# Patient Record
Sex: Male | Born: 1965 | Race: Black or African American | Hispanic: No | Marital: Married | Smoking: Former smoker
Health system: Southern US, Community
[De-identification: ages and names within clinical notes are randomized; demographics above are authoritative.]

## PROBLEM LIST (undated history)

## (undated) DIAGNOSIS — E119 Type 2 diabetes mellitus without complications: Secondary | ICD-10-CM

## (undated) HISTORY — DX: Type 2 diabetes mellitus without complications: E11.9

---

## 2001-04-07 ENCOUNTER — Observation Stay (HOSPITAL_COMMUNITY): Admission: EM | Admit: 2001-04-07 | Discharge: 2001-04-08 | Payer: Self-pay | Admitting: Emergency Medicine

## 2001-04-07 ENCOUNTER — Encounter: Payer: Self-pay | Admitting: Orthopedic Surgery

## 2001-04-07 ENCOUNTER — Encounter: Payer: Self-pay | Admitting: Emergency Medicine

## 2001-05-08 ENCOUNTER — Encounter: Admission: RE | Admit: 2001-05-08 | Discharge: 2001-06-05 | Payer: Self-pay | Admitting: Orthopedic Surgery

## 2006-09-26 ENCOUNTER — Ambulatory Visit: Payer: Self-pay | Admitting: Nurse Practitioner

## 2006-10-17 ENCOUNTER — Ambulatory Visit: Payer: Self-pay | Admitting: *Deleted

## 2007-07-10 ENCOUNTER — Encounter (INDEPENDENT_AMBULATORY_CARE_PROVIDER_SITE_OTHER): Payer: Self-pay | Admitting: *Deleted

## 2007-10-22 ENCOUNTER — Encounter (INDEPENDENT_AMBULATORY_CARE_PROVIDER_SITE_OTHER): Payer: Self-pay | Admitting: Nurse Practitioner

## 2007-10-22 ENCOUNTER — Ambulatory Visit: Payer: Self-pay | Admitting: Internal Medicine

## 2007-10-22 LAB — CONVERTED CEMR LAB
ALT: 45 units/L (ref 0–53)
AST: 29 units/L (ref 0–37)
Albumin: 4.7 g/dL (ref 3.5–5.2)
Alkaline Phosphatase: 112 units/L (ref 39–117)
BUN: 12 mg/dL (ref 6–23)
Basophils Absolute: 0 10*3/uL (ref 0.0–0.1)
Basophils Relative: 0 % (ref 0–1)
CO2: 23 meq/L (ref 19–32)
Calcium: 9.5 mg/dL (ref 8.4–10.5)
Chloride: 100 meq/L (ref 96–112)
Creatinine, Ser: 1.13 mg/dL (ref 0.40–1.50)
Eosinophils Absolute: 0.1 10*3/uL (ref 0.0–0.7)
Eosinophils Relative: 2 % (ref 0–5)
Glucose, Bld: 211 mg/dL — ABNORMAL HIGH (ref 70–99)
HCT: 43 % (ref 39.0–52.0)
Hemoglobin: 15.2 g/dL (ref 13.0–17.0)
Lymphocytes Relative: 31 % (ref 12–46)
Lymphs Abs: 2.4 10*3/uL (ref 0.7–4.0)
MCHC: 35.3 g/dL (ref 30.0–36.0)
MCV: 87.2 fL (ref 78.0–100.0)
Monocytes Absolute: 1.2 10*3/uL — ABNORMAL HIGH (ref 0.1–1.0)
Monocytes Relative: 15 % — ABNORMAL HIGH (ref 3–12)
Neutro Abs: 3.9 10*3/uL (ref 1.7–7.7)
Neutrophils Relative %: 52 % (ref 43–77)
Platelets: 222 10*3/uL (ref 150–400)
Potassium: 4.2 meq/L (ref 3.5–5.3)
RBC: 4.93 M/uL (ref 4.22–5.81)
RDW: 12.7 % (ref 11.5–15.5)
Sodium: 136 meq/L (ref 135–145)
TSH: 1.7 microintl units/mL (ref 0.350–5.50)
Total Bilirubin: 0.9 mg/dL (ref 0.3–1.2)
Total Protein: 8 g/dL (ref 6.0–8.3)
WBC: 7.6 10*3/uL (ref 4.0–10.5)

## 2007-12-30 ENCOUNTER — Ambulatory Visit: Payer: Self-pay | Admitting: Family Medicine

## 2008-06-15 ENCOUNTER — Ambulatory Visit: Payer: Self-pay | Admitting: Internal Medicine

## 2008-06-25 ENCOUNTER — Ambulatory Visit: Payer: Self-pay | Admitting: Internal Medicine

## 2008-06-25 ENCOUNTER — Ambulatory Visit (HOSPITAL_COMMUNITY): Admission: RE | Admit: 2008-06-25 | Discharge: 2008-06-25 | Payer: Self-pay | Admitting: Internal Medicine

## 2008-06-26 ENCOUNTER — Ambulatory Visit: Payer: Self-pay | Admitting: Internal Medicine

## 2008-07-28 ENCOUNTER — Ambulatory Visit: Payer: Self-pay | Admitting: Family Medicine

## 2008-07-28 LAB — CONVERTED CEMR LAB: Microalb, Ur: 8.01 mg/dL — ABNORMAL HIGH (ref 0.00–1.89)

## 2008-07-31 ENCOUNTER — Ambulatory Visit: Payer: Self-pay | Admitting: Internal Medicine

## 2008-09-14 ENCOUNTER — Ambulatory Visit: Payer: Self-pay | Admitting: Internal Medicine

## 2008-09-14 ENCOUNTER — Encounter (INDEPENDENT_AMBULATORY_CARE_PROVIDER_SITE_OTHER): Payer: Self-pay | Admitting: Family Medicine

## 2008-09-14 LAB — CONVERTED CEMR LAB
ALT: 37 units/L (ref 0–53)
AST: 26 units/L (ref 0–37)
Albumin: 4.6 g/dL (ref 3.5–5.2)
Alkaline Phosphatase: 86 units/L (ref 39–117)
BUN: 11 mg/dL (ref 6–23)
Basophils Absolute: 0 10*3/uL (ref 0.0–0.1)
Basophils Relative: 1 % (ref 0–1)
CO2: 21 meq/L (ref 19–32)
Calcium: 9.4 mg/dL (ref 8.4–10.5)
Chloride: 106 meq/L (ref 96–112)
Cholesterol: 217 mg/dL — ABNORMAL HIGH (ref 0–200)
Creatinine, Ser: 0.92 mg/dL (ref 0.40–1.50)
Eosinophils Absolute: 0.4 10*3/uL (ref 0.0–0.7)
Eosinophils Relative: 6 % — ABNORMAL HIGH (ref 0–5)
Glucose, Bld: 171 mg/dL — ABNORMAL HIGH (ref 70–99)
HCT: 43 % (ref 39.0–52.0)
HDL: 45 mg/dL (ref >39)
Hemoglobin: 14.2 g/dL (ref 13.0–17.0)
LDL Cholesterol: 126 mg/dL — ABNORMAL HIGH (ref 0–99)
Lymphocytes Relative: 36 % (ref 12–46)
Lymphs Abs: 2.8 10*3/uL (ref 0.7–4.0)
MCHC: 33 g/dL (ref 30.0–36.0)
MCV: 89.6 fL (ref 78.0–100.0)
Monocytes Absolute: 0.9 10*3/uL (ref 0.1–1.0)
Monocytes Relative: 11 % (ref 3–12)
Neutro Abs: 3.6 10*3/uL (ref 1.7–7.7)
Neutrophils Relative %: 47 % (ref 43–77)
PSA: 0.53 ng/mL (ref 0.10–4.00)
Platelets: 216 10*3/uL (ref 150–400)
Potassium: 4.2 meq/L (ref 3.5–5.3)
RBC: 4.8 M/uL (ref 4.22–5.81)
RDW: 13.4 % (ref 11.5–15.5)
Sodium: 142 meq/L (ref 135–145)
Total Bilirubin: 0.9 mg/dL (ref 0.3–1.2)
Total CHOL/HDL Ratio: 4.8
Total Protein: 7.1 g/dL (ref 6.0–8.3)
Triglycerides: 230 mg/dL — ABNORMAL HIGH (ref <150)
VLDL: 46 mg/dL — ABNORMAL HIGH (ref 0–40)
WBC: 7.7 10*3/uL (ref 4.0–10.5)

## 2008-12-23 ENCOUNTER — Ambulatory Visit: Payer: Self-pay | Admitting: Internal Medicine

## 2008-12-28 ENCOUNTER — Ambulatory Visit: Payer: Self-pay | Admitting: Internal Medicine

## 2008-12-28 ENCOUNTER — Encounter (INDEPENDENT_AMBULATORY_CARE_PROVIDER_SITE_OTHER): Payer: Self-pay | Admitting: Family Medicine

## 2008-12-28 LAB — CONVERTED CEMR LAB
ALT: 49 units/L (ref 0–53)
AST: 28 units/L (ref 0–37)
Albumin: 4.6 g/dL (ref 3.5–5.2)
Alkaline Phosphatase: 90 units/L (ref 39–117)
BUN: 13 mg/dL (ref 6–23)
CO2: 19 meq/L (ref 19–32)
Calcium: 9.3 mg/dL (ref 8.4–10.5)
Chloride: 106 meq/L (ref 96–112)
Cholesterol: 236 mg/dL — ABNORMAL HIGH (ref 0–200)
Creatinine, Ser: 0.84 mg/dL (ref 0.40–1.50)
Glucose, Bld: 139 mg/dL — ABNORMAL HIGH (ref 70–99)
HDL: 48 mg/dL (ref >39)
LDL Cholesterol: 115 mg/dL — ABNORMAL HIGH (ref 0–99)
Potassium: 4.1 meq/L (ref 3.5–5.3)
Sodium: 137 meq/L (ref 135–145)
Total Bilirubin: 0.9 mg/dL (ref 0.3–1.2)
Total CHOL/HDL Ratio: 4.9
Total Protein: 7.7 g/dL (ref 6.0–8.3)
Triglycerides: 364 mg/dL — ABNORMAL HIGH (ref <150)
VLDL: 73 mg/dL — ABNORMAL HIGH (ref 0–40)

## 2009-10-12 IMAGING — CR DG FINGER THUMB 2+V*R*
3 series · 3 of 3 positions shown · non-contrast
Comparison: None.

CLINICAL DATA: Pain and swelling particularly in the vicinity of
the IP joint.  No recent injuries.

RIGHT THUMB 2+V 06/25/2008:

[x finger pa right]
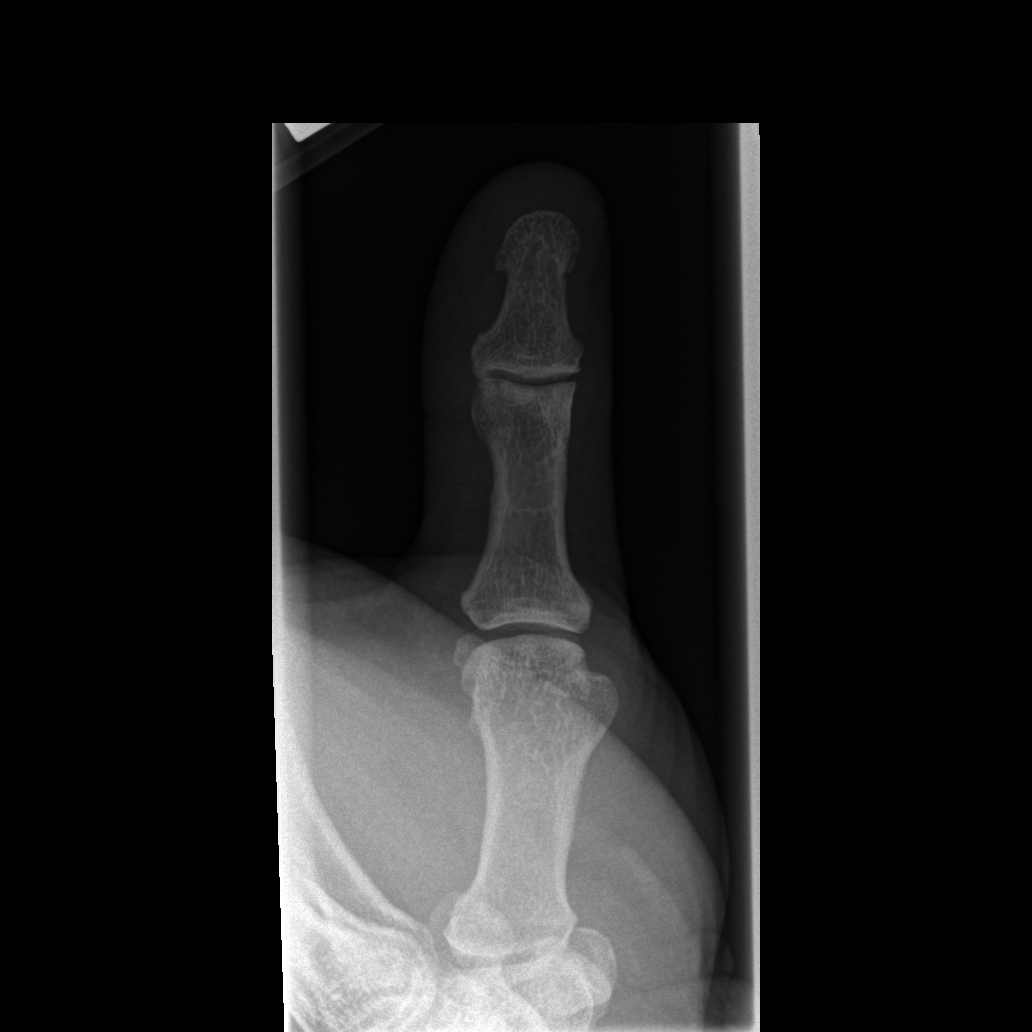

[x finger obl. right]
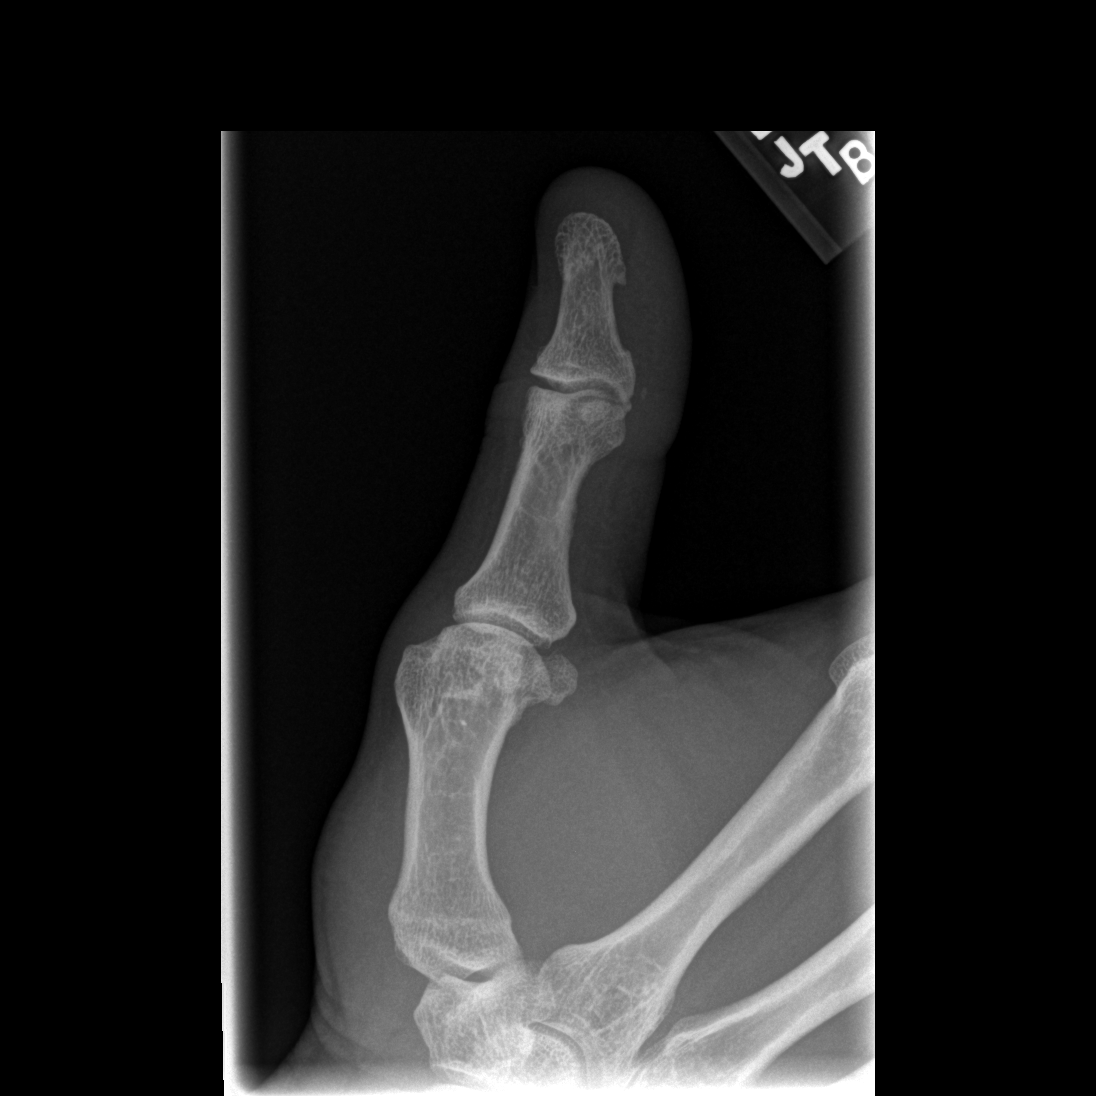

[x finger lateral right]
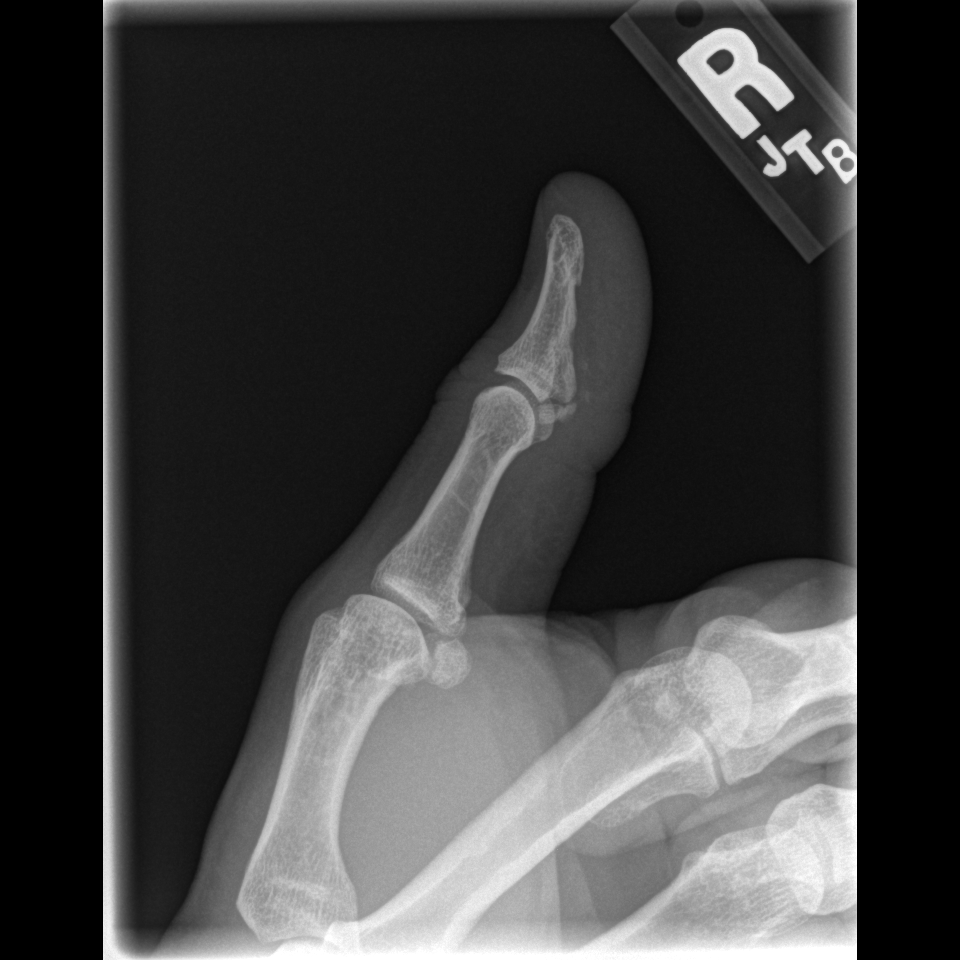

[3 of 3 positions shown; findings below may reference images not displayed]

FINDINGS: No evidence of acute or subacute fracture or dislocation.
Well-preserved joint spaces.  No intrinsic osseous abnormalities.
IMPRESSION: Normal examination.

## 2011-03-10 NOTE — H&P (Signed)
[place]  Patient:    Terry Horne, Terry Horne                      MRN: [id number] Adm. Date:  [id number] Attending:  Tobey Bride                         History and Physical  CHIEF COMPLAINT: Left patellar ligament rupture.  HISTORY OF PRESENT ILLNESS: The patient is a 45 year old man, who works as a short-distance Naval architect and was playing recreational basketball at approximately 1700 hours on April 08, 1999, was warming up doing a lay-up and felt a pop in his left knee, with immediate onset of pain and weakness, and his knee buckled.  He presented to the [facility] Emergency Room and was noted to have a defect in the patellar ligament and a high-riding patella on AP and lateral views of the knee.  He was unable to be straight leg raising and orthopedic consultation was obtained for presumed left patellar ligament rupture.  ALLERGIES: PENICILLIN.  MEDICATIONS: None.  PAST SURGICAL HISTORY:  1. ACL reconstruction on the right knee by Dr. Ronnell Guadalajara in 1982.  2. Excision of a pilonidal cyst in 1993.  SOCIAL HISTORY: He lives with his wife.  He lives in a two-story house, although it can be converted for single-story use.  He smokes 1-1/2 packs of cigarettes a day and uses two to three ounces of alcohol per week, usually in the form of a beer.  PHYSICAL EXAMINATION:  VITAL SIGNS: Respiratory rate was 18, pulse was 76, blood pressure was 130/80. He was afebrile.  GENERAL: Well-developed, well-nourished 45 year old black male in no apparent distress.  HEENT: EOMI.  Nose and throat clear.  NECK: Supple.  BACK: Spine nontender.  LUNGS: Lungs clear.  HEART: Regular rate and rhythm.  ABDOMEN: Soft, nontender.  EXTREMITIES: Full range of motion of upper extremities as well as the right lower extremity.  Well-healed surgical scar of right knee.  The left knee had a palpable defect in the patellar ligament.   Normal pulses of the feet, normal sensation to the feet.  He could not do a straight leg raise on the left side.  LABORATORY DATA: X-rays confirmed a high-riding patella alta consistent with an acute patellar ligament rupture.  There was very little in the way of any effusion in the knee and it is unlikely that he tore the retinaculum; the skin was intact and there were no abrasions.  ASSESSMENT: Acute left patellar ligament rupture with little, if any, effusion.  PLAN: He will be taken for open reduction and internal fixation this evening in the hopes of avoiding morbidity from effusion, swelling, and pain.  The risks and benefits of surgery were discussed with the patient and he was prepared for surgical intervention. DD:  04/07/01 TD:  04/08/01 Job: [id number]

## 2011-03-10 NOTE — Op Note (Signed)
[place]  Patient:    Terry Horne, Terry Horne                      MRN: [id number] Proc. Date: 04/07/01 Adm. Date:  [id number] Attending:  Carren Rang K                           Operative Report  PREOPERATIVE DIAGNOSIS:  Left patella ligament rupture.  POSTOPERATIVE DIAGNOSIS:  Left patella ligament rupture.  OPERATION:  Open reduction and internal fixation using two 5 mm Mersilene tape loops through drill holes in the distal third of the patella and the tibia tubercle.  The tendon itself was repaired with #1 Vicryl suture and the #5 Mersilene tapes were used to keep the patella in correct alignment with the tibia tubercle.  SURGEON:  Alinda Deem, M.D.  ASSISTANT:  Dorthula Matas, P.A.-C.  ANESTHESIA:  General endotracheal anesthesia.  ESTIMATED BLOOD LOSS:  Minimal.  FLUIDS:  800 cc of crystalloid.  TOURNIQUET:  Tourniquet time was 30 minutes.  INDICATION:  A 45 year old man playing basketball on April 07, 2001 and sustained an acute left patella tendon rupture with the patella to ligament ratio greater than 2:1 consistent with a complete rupture.  He was unable to extend his leg in the emergency room.  Remarkably, he had little if any swelling.  In any event, urgent repair of ligament was recommended and consented to by the patient.  DESCRIPTION OF PROCEDURE:  The patient identified by arm band and taken to the operating room at [facility].  Appropriate anesthetic monitors were attached.  General endotracheal anesthesia induced with the patient in the supine position.  Tourniquet was applied high to the left side and the left lower extremity prepped and draped in the usual sterile fashion from the ankle to the tourniquet.  Limb was wrapped with an Esmarch bandage. Tourniquet inflated to 350 mmHg.  An anterior midline incision was made from the proximal aspect of the patella just medial and distal to the  tibia tubercle.  We immediately identified the shredded ends of the ligaments anteriorly.  The shredded portion was intact to the patella and torn off of the tibia tubercle and then posteriorly a more major bundle was intact to the tubercle but torn off the patella.  At this point, we selected a 1/8th inch drill bit and drilled a transverse hole through the tibia tubercle and another one through the distal third of the patella transverse and then passed two 5 mm Mersilene tapes through these holes, putting a knot medial and another one lateral.  By tensioning these knots, we put the patella in the correct relation to the tibia tubercle recreating a ratio of the patella diagonal to the ligament of 1:1.  This was confirmed with C-arm imaging and the knots were then cinched down and multiple throws passed in each suture.  Using #1 Vicryl suture, we then sewed the retinacular tears medially and laterally using a running suture and also repaired the anterior and posterior leaflets of the tendon to each other again using a running #1 Vicryl suture. The tourniquet was let down.  Small bleeders were identified and cauterized. The wound was washed out with normal saline solution.  The subcutaneous tissue was then closed with running 2-0 Vicryl suture and the skin with staples.  A dressing of Xeroform, 4 by 4 dressing, sponges, Webril, an Ace wrap, and  a knee immobilizer were applied.  The patient was then awakened and taken to the recovery room without difficulty. DD:  04/07/01 TD:  04/08/01 Job: [id number]

## 2013-03-18 ENCOUNTER — Ambulatory Visit: Payer: Self-pay | Admitting: Family Medicine

## 2013-03-18 VITALS — BP 122/88 | HR 71 | Temp 97.9°F | Resp 16 | Ht 73.5 in | Wt 202.0 lb

## 2013-03-18 DIAGNOSIS — Z0289 Encounter for other administrative examinations: Secondary | ICD-10-CM

## 2013-03-18 DIAGNOSIS — Z0000 Encounter for general adult medical examination without abnormal findings: Secondary | ICD-10-CM

## 2013-03-18 NOTE — Progress Notes (Signed)
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  Patient has had diabetes for 7 years after having quit cigarettes 8 years ago. He's never been hospitalized and his blood sugars about under good control, although he does not remember his last A1c value. He has an appointment with his diabetes doctor tomorrow or [place] physician.  Objective: No disqualifying findings on DOT physical (see physical form)  Assessment: Qualified for one year certificate.

## 2013-05-10 ENCOUNTER — Encounter: Payer: Self-pay | Admitting: Family Medicine

## 2014-02-23 ENCOUNTER — Ambulatory Visit: Payer: Self-pay | Admitting: Emergency Medicine

## 2014-02-23 VITALS — BP 126/72 | HR 78 | Temp 98.3°F | Resp 16 | Ht 73.5 in | Wt 220.4 lb

## 2014-02-23 DIAGNOSIS — Z0289 Encounter for other administrative examinations: Secondary | ICD-10-CM

## 2014-02-23 NOTE — Progress Notes (Signed)
[facility] [address], [place] [address] [phone]  Date:  02/23/2014   Name:  Terry CongressCharles G Harvill   DOB:  01-Sep-1966   MRN:  [id number]  PCP:  No primary provider on file.    Chief Complaint: Annual Exam   History of Present Illness:  Terry CongressCharles G Salser is a 48 y.o. very pleasant male patient who presents with the following:  DOT  There are no active problems to display for this patient.   Past Medical History  Diagnosis Date  . Diabetes mellitus without complication     History reviewed. No pertinent past surgical history.  History  Substance Use Topics  . Smoking status: Former Games developermoker  . Smokeless tobacco: Not on file  . Alcohol Use: Yes    History reviewed. No pertinent family history.  No Known Allergies  Medication list has been reviewed and updated.  Current Outpatient Prescriptions on File Prior to Visit  Medication Sig Dispense Refill  . glimepiride (AMARYL) 4 MG tablet Take 4 mg by mouth daily before breakfast.      . metFORMIN (GLUCOPHAGE) 1000 MG tablet Take 1,000 mg by mouth 2 (two) times daily with a meal.       No current facility-administered medications on file prior to visit.    Review of Systems:  As per HPI, otherwise negative.    Physical Examination: Filed Vitals:   02/23/14 1429  BP: 126/72  Pulse: 78  Temp: 98.3 F (36.8 C)  Resp: 16   Filed Vitals:   02/23/14 1429  Height: 6' 1.5" (1.867 m)  Weight: 220 lb 6.4 oz (99.973 kg)   Body mass index is 28.68 kg/(m^2). Ideal Body Weight: Weight in (lb) to have BMI = 25: 191.7  GEN: WDWN, NAD, Non-toxic, A & O x 3 HEENT: Atraumatic, Normocephalic. Neck supple. No masses, No LAD. Ears and Nose: No external deformity. CV: RRR, No M/G/R. No JVD. No thrill. No extra heart sounds. PULM: CTA B, no wheezes, crackles, rhonchi. No retractions. No resp. distress. No accessory muscle use. ABD: S, NT, ND, +BS. No rebound. No HSM. EXTR: No c/c/e NEURO Normal gait.   PSYCH: Normally interactive. Conversant. Not depressed or anxious appearing.  Calm demeanor.    Assessment and Plan: DOT NIDDM  HBA1C 7.4  Signed,  Phillips OdorJeffery Anderson, MD

## 2015-03-09 ENCOUNTER — Ambulatory Visit (INDEPENDENT_AMBULATORY_CARE_PROVIDER_SITE_OTHER): Payer: Self-pay | Admitting: Family Medicine

## 2015-03-09 VITALS — BP 110/74 | HR 75 | Temp 98.6°F | Resp 20 | Ht 73.5 in | Wt 216.4 lb

## 2015-03-09 DIAGNOSIS — Z0000 Encounter for general adult medical examination without abnormal findings: Secondary | ICD-10-CM

## 2015-03-09 NOTE — Progress Notes (Signed)
[facility] [address], [place] [address] [phone]  Date:  03/09/2015   Name:  Terry Horne   DOB:  1966/06/13   MRN:  [id number]  PCP:  No primary care provider on file.    Chief Complaint: DOT Physical   History of Present Illness:  Terry Horne is a 49 y.o. very pleasant male patient who presents with the following:  Here today for a DOT PE.  History of DM- he is in the process of getting a new PCP but as far as he knows his A1c has been well controlled Occasional social alcohol History of knee surgery- OW his history is negative    There are no active problems to display for this patient.   Past Medical History  Diagnosis Date  . Diabetes mellitus without complication     History reviewed. No pertinent past surgical history.  History  Substance Use Topics  . Smoking status: Former Games developermoker  . Smokeless tobacco: Never Used  . Alcohol Use: 0.0 oz/week    0 Standard drinks or equivalent per week    History reviewed. No pertinent family history.  No Known Allergies  Medication list has been reviewed and updated.  Current Outpatient Prescriptions on File Prior to Visit  Medication Sig Dispense Refill  . glimepiride (AMARYL) 4 MG tablet Take 4 mg by mouth daily before breakfast.    . metFORMIN (GLUCOPHAGE) 1000 MG tablet Take 1,000 mg by mouth 2 (two) times daily with a meal.     No current facility-administered medications on file prior to visit.    Review of Systems:  .sper   Physical Examination: Filed Vitals:   03/09/15 1821  BP: 110/74  Pulse: 75  Temp: 98.6 F (37 C)  Resp: 20   Filed Vitals:   03/09/15 1821  Height: 6' 1.5" (1.867 m)  Weight: 216 lb 6 oz (98.147 kg)   Body mass index is 28.16 kg/(m^2). Ideal Body Weight: Weight in (lb) to have BMI = 25: 191.7  GEN: WDWN, NAD, Non-toxic, A & O x 3, looks well, mild overweight HEENT: Atraumatic, Normocephalic. Neck supple. No masses, No LAD. Bilateral  TM wnl, oropharynx normal.  PEERL,EOMI.   Ears and Nose: No external deformity. CV: RRR, No M/G/R. No JVD. No thrill. No extra heart sounds. PULM: CTA B, no wheezes, crackles, rhonchi. No retractions. No resp. distress. No accessory muscle use. ABD: S, NT, ND EXTR: No c/c/e NEURO Normal gait.  PSYCH: Normally interactive. Conversant. Not depressed or anxious appearing.  Calm demeanor.  No inguinal hernia Normal strength and DTR all extremities   Assessment and Plan: Physical exam  DOT today- 1 year card due to controled DM   Signed Abbe AmsterdamJessica Maie Kesinger, MD

## 2016-03-01 ENCOUNTER — Ambulatory Visit (INDEPENDENT_AMBULATORY_CARE_PROVIDER_SITE_OTHER): Payer: Self-pay | Admitting: Physician Assistant

## 2016-03-01 VITALS — BP 126/84 | HR 79 | Temp 98.5°F | Resp 18 | Ht 74.0 in | Wt 227.0 lb

## 2016-03-01 DIAGNOSIS — R81 Glycosuria: Secondary | ICD-10-CM

## 2016-03-01 DIAGNOSIS — Z021 Encounter for pre-employment examination: Secondary | ICD-10-CM

## 2016-03-01 DIAGNOSIS — Z0289 Encounter for other administrative examinations: Secondary | ICD-10-CM

## 2016-03-01 LAB — POCT GLYCOSYLATED HEMOGLOBIN (HGB A1C): HEMOGLOBIN A1C: 8.3

## 2016-03-01 NOTE — Progress Notes (Signed)
Commercial Driver Medical Examination   Terry Horne is a 50 y.o. male who presents today for a commercial driver fitness determination physical exam. The patient reports no problems. The following portions of the patient's history were reviewed and updated as appropriate: current medications. Review of Systems Endocrine: negative for diabetic symptoms including blurry vision, increased fatigue, polydipsia, polyphagia, polyuria and poor wound healing   Objective:    Vision/hearing:  Visual Acuity Screening   Right eye Left eye Both eyes  Without correction:     With correction: 20/15 20/20 20/13   Comments: Peripheral Vision: Right eye 70 degrees. Left eye 70 degrees.  The patient can distinguish the colors red, amber and green.  Hearing Screening Comments: The patient was able to hear a forced whisper from 10 feet.  Applicant can recognize and distinguish among traffic control signals and devices showing standard red, green, and amber colors.  Corrective lenses required: Yes  Monocular Vision?: No  Hearing aid requirement: No  Physical Exam  Constitutional: He is oriented to person, place, and time. He appears well-developed. He does not appear ill.  Eyes: Conjunctivae and EOM are normal. Pupils are equal, round, and reactive to light.  Cardiovascular: Normal rate.   Pulmonary/Chest: Effort normal.  Abdominal: He exhibits no distension.  Musculoskeletal: Normal range of motion.  Neurological: He is alert and oriented to person, place, and time. No cranial nerve deficit. Coordination normal.  Skin: Skin is warm and dry. He is not diaphoretic.  Psychiatric: He has a normal mood and affect.  Nursing note and vitals reviewed.   BP 126/84 mmHg  Pulse 79  Temp(Src) 98.5 F (36.9 C)  Resp 18  Ht 6\' 2"  (1.88 m)  Wt 227 lb (102.967 kg)  BMI 29.13 kg/m2  SpO2 99%  Labs: Comments: SG: 1.015, BLO: Neg, PRO: Neg, GLU: 100 or 1/10 (trace)   No results found for:  HGBA1C   Assessment:    Healthy male exam. He has a history of diabetes type 2 however this is well controlled and he is under the care of a PCP.  Per the  [facility] a one year or two year certificate is at the discretion of the medical examiner.  Given that his A1c is 8.3 today he is to return in one year for re-examination.     Meets standards in 49 CFR 391.41;  qualifies for a 1 year certificate given diabetes.     Plan:    Medical examiners certificate completed and printed. Return as needed.

## 2017-02-20 ENCOUNTER — Encounter: Payer: Self-pay | Admitting: Family Medicine

## 2017-02-20 ENCOUNTER — Ambulatory Visit (INDEPENDENT_AMBULATORY_CARE_PROVIDER_SITE_OTHER): Payer: Self-pay | Admitting: Family Medicine

## 2017-02-20 VITALS — BP 138/78 | HR 80 | Temp 99.1°F | Resp 16 | Ht 73.5 in | Wt 223.6 lb

## 2017-02-20 DIAGNOSIS — Z024 Encounter for examination for driving license: Secondary | ICD-10-CM

## 2017-02-20 NOTE — Progress Notes (Signed)
By signing my name below, I, Mesha Guinyard, attest that this documentation has been prepared under the direction and in the presence of Meredith Staggers, MD.  Electronically Signed: Arvilla Market, Medical Scribe. 02/20/17. 2:54 PM.  Subjective:    Patient ID: Terry Horne, male    DOB: [birth year], 51 y.o.   MRN: [id number]  HPI Chief Complaint  Patient presents with  . Annual Exam    dot    HPI Comments: Terry Horne is a 51 y.o. male who presents to the Primary Care at [facility] for DOT physical. 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  DM: Pt is followed by Dr. Jackie Plum at [facility] in [place]. Pt hasn't changed his medications at his last visit. Denies diabetic neuropathy, or diabetic changes in his eyes. Pt is not a smoker. Denies SOB,chest pain.  HLD: Compliant with tricor. Denies heart attacks, or stents.  Hypothyroidism: Compliant with synthroid.  Sleep: Denies sleep apnea, or daytime somnolence.  Surgery: Knee surgery since 2002 and it hasn't given out.  Vision/Hearing:  Visual Acuity Screening   Right eye Left eye Both eyes  Without correction:     With correction: 20/20-1 20/20-1 20/15-1  Comments: 85 horizontal  Color test passed  Hearing Screening Comments: 10 ft whisper test passed bilateral   There are no active problems to display for this patient.  Past Medical History:  Diagnosis Date  . Diabetes mellitus without complication (HCC)    History reviewed. No pertinent surgical history. No Known Allergies Prior to Admission medications   Medication Sig Start Date End Date Taking? Authorizing Provider  fenofibrate (TRICOR) 48 MG tablet Take 48 mg by mouth daily.   Yes Historical Provider, MD  glimepiride (AMARYL) 4 MG tablet Take 4 mg by mouth daily before breakfast.   Yes Historical Provider, MD  levothyroxine (SYNTHROID, LEVOTHROID) 88 MCG tablet Take 88 mcg by mouth  daily before breakfast.   Yes Historical Provider, MD  metFORMIN (GLUCOPHAGE) 1000 MG tablet Take 1,000 mg by mouth 2 (two) times daily with a meal.   Yes Historical Provider, MD   Social History   Social History  . Marital status: Married    Spouse name: N/A  . Number of children: N/A  . Years of education: N/A   Occupational History  . Not on file.   Social History Main Topics  . Smoking status: Former Games developer  . Smokeless tobacco: Never Used  . Alcohol use 0.0 oz/week  . Drug use: No  . Sexual activity: Yes   Other Topics Concern  . Not on file   Social History Narrative  . No narrative on file   Review of Systems Per DOT form Objective:  Physical Exam  Constitutional: He is oriented to person, place, and time. He appears well-developed and well-nourished.  HENT:  Head: Normocephalic and atraumatic.  Right Ear: External ear normal.  Left Ear: External ear normal.  Mouth/Throat: Oropharynx is clear and moist.  Eyes: Conjunctivae and EOM are normal. Pupils are equal, round, and reactive to light.  Neck: Normal range of motion. Neck supple. No thyromegaly present.  Cardiovascular: Normal rate, regular rhythm, normal heart sounds and intact distal pulses.   Pulmonary/Chest: Effort normal and breath sounds normal. No respiratory distress. He has no wheezes.  Abdominal: Soft. He exhibits no distension. There is no tenderness. Hernia confirmed negative in the right inguinal area and confirmed negative  in the left inguinal area.  Musculoskeletal: Normal range of motion. He exhibits no edema or tenderness.  Lymphadenopathy:    He has no cervical adenopathy.  Neurological: He is alert and oriented to person, place, and time. He has normal reflexes.  Skin: Skin is warm and dry.  Psychiatric: He has a normal mood and affect. His behavior is normal.  Vitals reviewed.     Vitals:   02/20/17 1423 02/20/17 1436  BP: (!) 159/88 138/78  Pulse: 80   Resp: 16   Temp: 99.1 F  (37.3 C)   TempSrc: Oral   SpO2: 98%   Weight: 223 lb 9.6 oz (101.4 kg)   Height: 6' 1.5" (1.867 m)   Body mass index is 29.1 kg/m.  Assessment & Plan:   Terry Horne is a 51 y.o. male Encounter for commercial driver medical examination (CDME)  One year card given with history of diabetes. Last A1c not controlled, but under 10. Recommended to continue follow-up with primary care provider for adjustment of medication if needed. See paperwork. 1 year card with corrective lenses.  Meds ordered this encounter  Medications  . fenofibrate (TRICOR) 48 MG tablet    Sig: Take 48 mg by mouth daily.  Marland Kitchen levothyroxine (SYNTHROID, LEVOTHROID) 88 MCG tablet    Sig: Take 88 mcg by mouth daily before breakfast.   Patient Instructions   Based on last hemoglobin A1c, diabetes is not well controlled, but does pass for DOT standards. Keep follow-up as scheduled with your primary care provider. One year card provided for DOT.    IF you received an x-ray today, you will receive an invoice from [facility]. Please contact [facility] at [phone] with questions or concerns regarding your invoice.   IF you received labwork today, you will receive an invoice from [place]. Please contact [facility] at [phone] with questions or concerns regarding your invoice.   Our billing staff will not be able to assist you with questions regarding bills from these companies.  You will be contacted with the lab results as soon as they are available. The fastest way to get your results is to activate your My Chart account. Instructions are located on the last page of this paperwork. If you have not heard from [place] regarding the results in 2 weeks, please contact this office.       I personally performed the services described in this documentation, which was scribed in my presence. The recorded information has been reviewed and considered for accuracy and completeness, addended by me as needed,  and agree with information above.  Signed,   Meredith Staggers, MD Primary Care at [facility].  02/20/17 3:17 PM

## 2017-02-20 NOTE — Patient Instructions (Addendum)
Based on last hemoglobin A1c, diabetes is not well controlled, but does pass for DOT standards. Keep follow-up as scheduled with your primary care provider. One year card provided for DOT.    IF you received an x-ray today, you will receive an invoice from [facility]. Please contact [facility] at [phone] with questions or concerns regarding your invoice.   IF you received labwork today, you will receive an invoice from [place]. Please contact [facility] at [phone] with questions or concerns regarding your invoice.   Our billing staff will not be able to assist you with questions regarding bills from these companies.  You will be contacted with the lab results as soon as they are available. The fastest way to get your results is to activate your My Chart account. Instructions are located on the last page of this paperwork. If you have not heard from [place] regarding the results in 2 weeks, please contact this office.

## 2019-02-20 DIAGNOSIS — E1165 Type 2 diabetes mellitus with hyperglycemia: Secondary | ICD-10-CM | POA: Insufficient documentation

## 2019-02-20 DIAGNOSIS — E7800 Pure hypercholesterolemia, unspecified: Secondary | ICD-10-CM | POA: Insufficient documentation

## 2019-02-20 DIAGNOSIS — J302 Other seasonal allergic rhinitis: Secondary | ICD-10-CM | POA: Insufficient documentation

## 2019-02-20 DIAGNOSIS — E039 Hypothyroidism, unspecified: Secondary | ICD-10-CM | POA: Insufficient documentation

## 2019-12-22 DIAGNOSIS — Z125 Encounter for screening for malignant neoplasm of prostate: Secondary | ICD-10-CM | POA: Insufficient documentation

## 2019-12-22 DIAGNOSIS — E1169 Type 2 diabetes mellitus with other specified complication: Secondary | ICD-10-CM | POA: Insufficient documentation

## 2019-12-22 DIAGNOSIS — E785 Hyperlipidemia, unspecified: Secondary | ICD-10-CM | POA: Insufficient documentation

## 2020-05-04 DIAGNOSIS — E1165 Type 2 diabetes mellitus with hyperglycemia: Secondary | ICD-10-CM | POA: Insufficient documentation

## 2020-09-21 DIAGNOSIS — N5202 Corporo-venous occlusive erectile dysfunction: Secondary | ICD-10-CM | POA: Insufficient documentation

## 2021-05-09 ENCOUNTER — Ambulatory Visit (HOSPITAL_COMMUNITY)
Admission: EM | Admit: 2021-05-09 | Discharge: 2021-05-09 | Disposition: A | Discharge status: Home / Self Care | Payer: 59 | Attending: Emergency Medicine | Admitting: Emergency Medicine

## 2021-05-09 ENCOUNTER — Encounter (HOSPITAL_COMMUNITY): Payer: Self-pay

## 2021-05-09 ENCOUNTER — Other Ambulatory Visit: Payer: Self-pay

## 2021-05-09 DIAGNOSIS — B349 Viral infection, unspecified: Secondary | ICD-10-CM | POA: Insufficient documentation

## 2021-05-09 DIAGNOSIS — Z20822 Contact with and (suspected) exposure to covid-19: Secondary | ICD-10-CM | POA: Diagnosis not present

## 2021-05-09 LAB — POCT RAPID STREP A, ED / UC: Streptococcus, Group A Screen (Direct): NEGATIVE

## 2021-05-09 NOTE — ED Triage Notes (Signed)
Pt reports sore throat and lightheadedness since this morning. Aleve gives relief to sore throat. Denies fever.

## 2021-05-09 NOTE — Discharge Instructions (Signed)
Your strep test was negative  Your covid test is pending, you will be called if positive  Can try salt water gargles, hot liquids and throat lozenges for sore throat  Can use tylenol 650 mg or ibuprofen 600 mg every 6 hours as needed for fever, chills, bodyaches, headaches

## 2021-05-09 NOTE — ED Provider Notes (Addendum)
[facility]    CSN: [id number] Arrival date & time: 05/09/21  1330      History   Chief Complaint Chief Complaint  Patient presents with   Sore Throat   Dizziness    HPI Terry Horne is a 55 y.o. male.   Patient presents with sore throat, lightheadedness, non productive cough, fever, chills, body aches beginning this morning. Symptoms improved with use of aleve, tolerating food and liquids Returned from vacation last night. Possible sick contact. Denies headache, ear pain, shortness of breath, abdominal pain, nausea, vomiting, diarrhea.   Past Medical History:  Diagnosis Date   Diabetes mellitus without complication (HCC)     There are no problems to display for this patient.   History reviewed. No pertinent surgical history.     Home Medications    Prior to Admission medications   Medication Sig Start Date End Date Taking? Authorizing Provider  atorvastatin (LIPITOR) 20 MG tablet Take 20 mg by mouth daily. 02/14/21   [provider]  fenofibrate (TRICOR) 48 MG tablet Take 48 mg by mouth daily.    [provider]  glimepiride (AMARYL) 4 MG tablet Take 4 mg by mouth daily before breakfast.    [provider]  levothyroxine (SYNTHROID, LEVOTHROID) 88 MCG tablet Take 88 mcg by mouth daily before breakfast.    [provider]  metFORMIN (GLUCOPHAGE) 1000 MG tablet Take 1,000 mg by mouth 2 (two) times daily with a meal.    [provider]  Multiple Vitamins-Minerals (CENTRUM ADULTS) TABS Take by mouth.    [provider]  pioglitazone (ACTOS) 45 MG tablet Take 45 mg by mouth daily. 05/04/21   [provider]    Family History History reviewed. No pertinent family history.  Social History Social History   Tobacco Use   Smoking status: Former   Smokeless tobacco: Never  Substance Use Topics   Alcohol use: Yes    Alcohol/week: 0.0 standard drinks   Drug use: No     Allergies   Patient  has no known allergies.   Review of Systems Review of Systems Defer to HPI   Physical Exam Triage Vital Signs ED Triage Vitals  Enc Vitals Group     BP 05/09/21 1513 133/84     Pulse Rate 05/09/21 1513 88     Resp 05/09/21 1513 18     Temp 05/09/21 1513 98.5 F (36.9 C)     Temp Source 05/09/21 1513 Oral     SpO2 05/09/21 1513 95 %     Weight --      Height --      Head Circumference --      Peak Flow --      Pain Score 05/09/21 1511 2     Pain Loc --      Pain Edu? --      Excl. in GC? --    No data found.  Updated Vital Signs BP 133/84 (BP Location: Right Arm)   Pulse 88   Temp 98.5 F (36.9 C) (Oral)   Resp 18   SpO2 95%   Visual Acuity Right Eye Distance:   Left Eye Distance:   Bilateral Distance:    Right Eye Near:   Left Eye Near:    Bilateral Near:     Physical Exam Constitutional:      Appearance: He is well-developed and normal weight.  HENT:     Head: Normocephalic.     Right Ear: Tympanic  membrane, ear canal and external ear normal.     Left Ear: Tympanic membrane, ear canal and external ear normal.     Nose: Nose normal.     Mouth/Throat:     Mouth: Mucous membranes are moist.     Pharynx: Posterior oropharyngeal erythema present. No oropharyngeal exudate.     Tonsils: No tonsillar exudate or tonsillar abscesses. 0 on the right. 0 on the left.  Eyes:     Extraocular Movements: Extraocular movements intact.  Cardiovascular:     Rate and Rhythm: Normal rate and regular rhythm.     Pulses: Normal pulses.     Heart sounds: Normal heart sounds.  Pulmonary:     Effort: Pulmonary effort is normal.     Breath sounds: Normal breath sounds.  Musculoskeletal:     Cervical back: Normal range of motion and neck supple.  Skin:    General: Skin is warm and dry.  Neurological:     Mental Status: He is alert and oriented to person, place, and time. Mental status is at baseline.  Psychiatric:        Mood and Affect: Mood normal.        Behavior:  Behavior normal.     UC Treatments / Results  Labs (all labs ordered are listed, but only abnormal results are displayed) Labs Reviewed  SARS CORONAVIRUS 2 (TAT 6-24 HRS)  POCT RAPID STREP A, ED / UC    EKG   Radiology No results found.  Procedures Procedures (including critical care time)  Medications Ordered in UC Medications - No data to display  Initial Impression / Assessment and Plan / UC Course  I have reviewed the triage vital signs and the nursing notes.  Pertinent labs & imaging results that were available during my care of the patient were reviewed by me and considered in my medical decision making (see chart for details).  Viral illness  Rapid strep negative Covid test pending [facility] medications for symptom management  Final Clinical Impressions(s) / UC Diagnoses   Final diagnoses:  None     Discharge Instructions      Your strep test was negative  Your covid test is pending, you will be called if positive  Can try salt water gargles, hot liquids and throat lozenges for sore throat  Can use tylenol 650 mg or ibuprofen 600 mg every 6 hours as needed for fever, chills, bodyaches, headaches   ED Prescriptions   None    PDMP not reviewed this encounter.   Valinda Hoar, NP 05/09/21 1649    Valinda Hoar, NP 05/16/21 1420

## 2021-05-10 LAB — SARS CORONAVIRUS 2 (TAT 6-24 HRS): SARS Coronavirus 2: NEGATIVE

## 2021-05-12 LAB — CULTURE, GROUP A STREP (THRC)

## 2021-11-09 ENCOUNTER — Other Ambulatory Visit: Payer: Self-pay

## 2021-11-09 ENCOUNTER — Ambulatory Visit (INDEPENDENT_AMBULATORY_CARE_PROVIDER_SITE_OTHER): Payer: 59 | Admitting: Nurse Practitioner

## 2021-11-09 ENCOUNTER — Encounter: Payer: Self-pay | Admitting: Nurse Practitioner

## 2021-11-09 DIAGNOSIS — E785 Hyperlipidemia, unspecified: Secondary | ICD-10-CM | POA: Diagnosis not present

## 2021-11-09 DIAGNOSIS — E1169 Type 2 diabetes mellitus with other specified complication: Secondary | ICD-10-CM | POA: Diagnosis not present

## 2021-11-09 DIAGNOSIS — E039 Hypothyroidism, unspecified: Secondary | ICD-10-CM

## 2021-11-09 DIAGNOSIS — N5202 Corporo-venous occlusive erectile dysfunction: Secondary | ICD-10-CM | POA: Diagnosis not present

## 2021-11-09 MED ORDER — TADALAFIL 5 MG PO TABS: 5.0000 mg | ORAL_TABLET | Freq: Every day | ORAL | 0 refills | Status: DC | PRN

## 2021-11-09 MED ORDER — PIOGLITAZONE HCL 45 MG PO TABS: 45.0000 mg | ORAL_TABLET | Freq: Every day | ORAL | 2 refills | Status: DC

## 2021-11-09 MED ORDER — METFORMIN HCL 1000 MG PO TABS: 1000.0000 mg | ORAL_TABLET | Freq: Two times a day (BID) | ORAL | 2 refills | Status: DC

## 2021-11-09 NOTE — Patient Instructions (Signed)
Encounter to establish care Diabetes Hyperlipidemia Hypothyroidism ED:  Continue current medications  Stay active  Diabetic diet  Follow up:  Follow up with Dr. Andrey Campanile or Amy for a physical within 3 months

## 2021-11-09 NOTE — Progress Notes (Signed)
Virtual Visit via Telephone Note  I connected with Terry Horne on 11/09/21 at  1:00 PM EST by telephone and verified that I am speaking with the correct person using two identifiers.  Location: Patient: home Provider: office   I discussed the limitations, risks, security and privacy concerns of performing an evaluation and management service by telephone and the availability of in person appointments. I also discussed with the patient that there may be a patient responsible charge related to this service. The patient expressed understanding and agreed to proceed.   History of Present Illness:  Patient presents today to establish care through telephone visit.  Patient is switching from his current PCP at [facility].  Records are available in care everywhere.  Patient does have a history of diabetes, hyperlipidemia, hypothyroidism, ED.  Patient's last recorded hemoglobin A1c was on 04/18/2021 and was 8.9.  Patient does not regularly check blood sugars at home.  He states that he is compliant with medications.  He does need refills on 3 of his medications today.  He states that overall he has been doing well with no new issues or concerns today. Denies f/c/s, n/v/d, hemoptysis, PND, chest pain or edema.      Observations/Objective:  Vitals with BMI 05/09/2021 02/20/2017 02/20/2017  Height - - 6' 1.5"  Weight - - 223 lbs 10 oz  BMI - - 29.2  Systolic 133 138 161  Diastolic 84 78 88  Pulse 88 - 80     Assessment and Plan:  Encounter to establish care Diabetes Hyperlipidemia Hypothyroidism ED:  Continue current medications  Stay active  Diabetic diet  Follow up:  Follow up with Dr. Andrey Campanile or Amy for a physical within 3 months    I discussed the assessment and treatment plan with the patient. The patient was provided an opportunity to ask questions and all were answered. The patient agreed with the plan and demonstrated an understanding of the instructions.   The patient  was advised to call back or seek an in-person evaluation if the symptoms worsen or if the condition fails to improve as anticipated.  I provided 23 minutes of non-face-to-face time during this encounter.   Terry Andrew, NP

## 2021-12-08 ENCOUNTER — Telehealth: Payer: Self-pay | Admitting: *Deleted

## 2021-12-09 ENCOUNTER — Other Ambulatory Visit: Payer: Self-pay

## 2021-12-09 ENCOUNTER — Encounter: Payer: Self-pay | Admitting: Nurse Practitioner

## 2021-12-09 ENCOUNTER — Ambulatory Visit (INDEPENDENT_AMBULATORY_CARE_PROVIDER_SITE_OTHER): Payer: 59 | Admitting: Nurse Practitioner

## 2021-12-09 VITALS — BP 132/71 | HR 79 | Temp 98.3°F | Wt 223.0 lb

## 2021-12-09 DIAGNOSIS — E039 Hypothyroidism, unspecified: Secondary | ICD-10-CM | POA: Diagnosis not present

## 2021-12-09 DIAGNOSIS — N5202 Corporo-venous occlusive erectile dysfunction: Secondary | ICD-10-CM

## 2021-12-09 DIAGNOSIS — E1165 Type 2 diabetes mellitus with hyperglycemia: Secondary | ICD-10-CM

## 2021-12-09 DIAGNOSIS — E785 Hyperlipidemia, unspecified: Secondary | ICD-10-CM | POA: Diagnosis not present

## 2021-12-09 DIAGNOSIS — E1169 Type 2 diabetes mellitus with other specified complication: Secondary | ICD-10-CM | POA: Diagnosis not present

## 2021-12-09 LAB — POCT GLYCOSYLATED HEMOGLOBIN (HGB A1C): Hemoglobin A1C: 8.6 % — AB (ref 4.0–5.6)

## 2021-12-09 MED ORDER — TADALAFIL 5 MG PO TABS: 5.0000 mg | ORAL_TABLET | Freq: Every day | ORAL | 0 refills | Status: DC | PRN

## 2021-12-09 MED ORDER — ATORVASTATIN CALCIUM 20 MG PO TABS: 20.0000 mg | ORAL_TABLET | Freq: Every day | ORAL | 2 refills | Status: DC

## 2021-12-09 MED ORDER — GLIMEPIRIDE 4 MG PO TABS: 4.0000 mg | ORAL_TABLET | Freq: Every day | ORAL | 2 refills | Status: DC

## 2021-12-09 NOTE — Assessment & Plan Note (Signed)
Acquired hypothyroidism  -levothyroxine (SYNTHROID, LEVOTHROID) 88 MCG tablet by mouth daily before breakfast.

## 2021-12-09 NOTE — Progress Notes (Signed)
Pt presents for medication refill, needs refill atorvastatin, glimepiride(90 day), and Cialis(30 day)

## 2021-12-09 NOTE — Telephone Encounter (Signed)
Late entry- patient came in office on 12/08/2021 to request refills on medication. Patient was upset because he had tried calling the office several times prior to coming in the office. He stated he needed medication refills prior to next appointment that is scheduled in April.   Advised patient that this RN was unable to send medication to pharmacy since he had no ab test and or result to go by from previous appt in January, 2023. Scheduled an appt for patient for 12/09/2021.   Patient receptive and verbalized understanding.
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
## 2021-12-09 NOTE — Assessment & Plan Note (Signed)
Corporo-venous occlusive erectile dysfunction -adalafil (CIALIS) 5 MG tablet daily as needed

## 2021-12-09 NOTE — Assessment & Plan Note (Signed)
Type 2 diabetes mellitus with hyperglycemia, without long-term current use of insulin (HCC)  Lab Results  Component Value Date   HGBA1C 8.6 (A) 12/09/2021   - glimepiride (AMARYL) 4 MG tablet take 4 mg by mouth daily before breakfast. -pioglitazone (ACTOS) 45 MG tablet by mouth daily.  Continue diabetic diet Continue exercise

## 2021-12-09 NOTE — Assessment & Plan Note (Signed)
1. Hyperlipidemia associated with type 2 diabetes mellitus (HCC) - atorvastatin (LIPITOR) 20 MG tablet daily - refilled today - fenofibrate (TRICOR) 48 MG tablet daily  Low cholesterol diet Exercise daily

## 2021-12-09 NOTE — Patient Instructions (Signed)
1. Hyperlipidemia associated with type 2 diabetes mellitus (HCC) - atorvastatin (LIPITOR) 20 MG tablet daily - refilled today - fenofibrate (TRICOR) 48 MG tablet daily  Low cholesterol diet Exercise daily   2. Type 2 diabetes mellitus with hyperglycemia, without long-term current use of insulin (HCC)  Lab Results  Component Value Date   HGBA1C 8.6 (A) 12/09/2021   - glimepiride (AMARYL) 4 MG tablet take 4 mg by mouth daily before breakfast. -pioglitazone (ACTOS) 45 MG tablet by mouth daily.  Continue diabetic diet Continue exercise  3. Acquired hypothyroidism  -levothyroxine (SYNTHROID, LEVOTHROID) 88 MCG tablet by mouth daily before breakfast.  4. Corporo-venous occlusive erectile dysfunction -adalafil (CIALIS) 5 MG tablet daily as needed  Will order labs: Lab Orders         Lipid panel         Basic metabolic panel         TSH         POCT glycosylated hemoglobin (Hb A1C)     Follow up:  Follow up as scheduled in May with Dr .Andrey Campanile

## 2021-12-10 LAB — BASIC METABOLIC PANEL
BUN/Creatinine Ratio: 9 (ref 9–20)
BUN: 9 mg/dL (ref 6–24)
CO2: 24 mmol/L (ref 20–29)
Calcium: 10.1 mg/dL (ref 8.7–10.2)
Chloride: 101 mmol/L (ref 96–106)
Creatinine, Ser: 1 mg/dL (ref 0.76–1.27)
Glucose: 152 mg/dL — ABNORMAL HIGH (ref 70–99)
Potassium: 4.4 mmol/L (ref 3.5–5.2)
Sodium: 141 mmol/L (ref 134–144)
eGFR: 88 mL/min/{1.73_m2} (ref >59)

## 2021-12-10 LAB — LIPID PANEL
Chol/HDL Ratio: 3.4 ratio (ref 0.0–5.0)
Cholesterol, Total: 156 mg/dL (ref 100–199)
HDL: 46 mg/dL (ref >39)
LDL Chol Calc (NIH): 76 mg/dL (ref 0–99)
Triglycerides: 201 mg/dL — ABNORMAL HIGH (ref 0–149)
VLDL Cholesterol Cal: 34 mg/dL (ref 5–40)

## 2021-12-10 LAB — TSH: TSH: 3.28 u[IU]/mL (ref 0.450–4.500)

## 2022-01-09 ENCOUNTER — Other Ambulatory Visit: Payer: Self-pay | Admitting: Nurse Practitioner

## 2022-01-09 DIAGNOSIS — N5202 Corporo-venous occlusive erectile dysfunction: Secondary | ICD-10-CM

## 2022-01-19 ENCOUNTER — Ambulatory Visit (INDEPENDENT_AMBULATORY_CARE_PROVIDER_SITE_OTHER): Payer: 59 | Admitting: Family Medicine

## 2022-01-19 ENCOUNTER — Encounter: Payer: Self-pay | Admitting: Family Medicine

## 2022-01-19 VITALS — BP 145/87 | HR 91 | Temp 98.7°F | Resp 16 | Ht 74.0 in | Wt 236.2 lb

## 2022-01-19 DIAGNOSIS — E039 Hypothyroidism, unspecified: Secondary | ICD-10-CM | POA: Diagnosis not present

## 2022-01-19 DIAGNOSIS — E1165 Type 2 diabetes mellitus with hyperglycemia: Secondary | ICD-10-CM

## 2022-01-19 DIAGNOSIS — N5202 Corporo-venous occlusive erectile dysfunction: Secondary | ICD-10-CM | POA: Diagnosis not present

## 2022-01-19 DIAGNOSIS — E1169 Type 2 diabetes mellitus with other specified complication: Secondary | ICD-10-CM | POA: Diagnosis not present

## 2022-01-19 DIAGNOSIS — E785 Hyperlipidemia, unspecified: Secondary | ICD-10-CM

## 2022-01-19 MED ORDER — PIOGLITAZONE HCL 45 MG PO TABS: 45.0000 mg | ORAL_TABLET | Freq: Every day | ORAL | 1 refills | Status: DC

## 2022-01-19 MED ORDER — LEVOTHYROXINE SODIUM 88 MCG PO TABS: 88.0000 ug | ORAL_TABLET | Freq: Every day | ORAL | 1 refills | Status: DC

## 2022-01-19 MED ORDER — TADALAFIL 5 MG PO TABS: 5.0000 mg | ORAL_TABLET | Freq: Every day | ORAL | 2 refills | Status: DC | PRN

## 2022-01-19 MED ORDER — METFORMIN HCL 1000 MG PO TABS: 1000.0000 mg | ORAL_TABLET | Freq: Two times a day (BID) | ORAL | 1 refills | Status: DC

## 2022-01-19 MED ORDER — ATORVASTATIN CALCIUM 20 MG PO TABS: 20.0000 mg | ORAL_TABLET | Freq: Every day | ORAL | 1 refills | Status: DC

## 2022-01-19 MED ORDER — GLIMEPIRIDE 4 MG PO TABS: 4.0000 mg | ORAL_TABLET | Freq: Every day | ORAL | 1 refills | Status: DC

## 2022-01-19 NOTE — Progress Notes (Signed)
Patient is here for medication refill. Patient is not willing to answer any gap question today . Patient was listening to music on phone while trying to check him in. Patient vs were done .  Care continue ?

## 2022-01-19 NOTE — Progress Notes (Signed)
? ?Established Patient Office Visit ? ?Subjective:  ?Patient ID: Terry Horne, male    DOB: 1966/07/06  Age: 56 y.o. MRN: [id number] ? ?CC:  ?Chief Complaint  ?Patient presents with  ? Annual Exam  ? ? ?HPI ?Terry Horne presents for follow up of chronic med issues. Patient denies acute complaints or concerns.  ? ?Past Medical History:  ?Diagnosis Date  ? Diabetes mellitus without complication (HCC)   ? ? ?History reviewed. No pertinent surgical history. ? ?History reviewed. No pertinent family history. ? ?Social History  ? ?Socioeconomic History  ? Marital status: Married  ?  Spouse name: Not on file  ? Number of children: Not on file  ? Years of education: Not on file  ? Highest education level: Not on file  ?Occupational History  ? Not on file  ?Tobacco Use  ? Smoking status: Former  ? Smokeless tobacco: Never  ?Substance and Sexual Activity  ? Alcohol use: Yes  ?  Alcohol/week: 0.0 standard drinks  ? Drug use: No  ? Sexual activity: Yes  ?Other Topics Concern  ? Not on file  ?Social History Narrative  ? Not on file  ? ?Social Determinants of Health  ? ?Financial Resource Strain: Not on file  ?Food Insecurity: Not on file  ?Transportation Needs: Not on file  ?Physical Activity: Not on file  ?Stress: Not on file  ?Social Connections: Not on file  ?Intimate Partner Violence: Not on file  ? ? ?ROS ?Review of Systems  ?All other systems reviewed and are negative. ? ?Objective:  ? ?Today's Vitals: BP (!) 145/87   Pulse 91   Temp 98.7 ?F (37.1 ?C) (Oral)   Resp 16   Ht 6\' 2"  (1.88 m)   Wt 236 lb 3.2 oz (107.1 kg)   SpO2 98%   BMI 30.33 kg/m?  ? ?Physical Exam ?Vitals and nursing note reviewed.  ?Constitutional:   ?   General: He is not in acute distress. ?Cardiovascular:  ?   Rate and Rhythm: Normal rate and regular rhythm.  ?Pulmonary:  ?   Effort: Pulmonary effort is normal.  ?   Breath sounds: Normal breath sounds.  ?Abdominal:  ?   Palpations: Abdomen is soft.  ?   Tenderness: There is no abdominal  tenderness.  ?Neurological:  ?   General: No focal deficit present.  ?   Mental Status: He is alert and oriented to person, place, and time.  ? ? ?Assessment & Plan:  ? ?1. Type 2 diabetes mellitus with hyperglycemia, without long-term current use of insulin (HCC) ?Recent A1c was elevated above goal. 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 Will monitor.  ?- glimepiride (AMARYL) 4 MG tablet; Take 1 tablet (4 mg total) by mouth daily before breakfast.  Dispense: 90 tablet; Refill: 1 ? ?2. Hyperlipidemia associated with type 2 diabetes mellitus (HCC) ?Continue present management. Meds refilled.  ?- atorvastatin (LIPITOR) 20 MG tablet; Take 1 tablet (20 mg total) by mouth daily.  Dispense: 90 tablet; Refill: 1 ? ?3. Corporo-venous occlusive erectile dysfunction ?Meds refilled ?- tadalafil (CIALIS) 5 MG tablet; Take 1 tablet (5 mg total) by mouth daily as needed.  Dispense: 10 tablet; Refill: 2 ? ?4. Acquired hypothyroidism ?[facility] refilled. Continue present management ? ? ? ? ?Outpatient Encounter Medications as of 01/19/2022  ?Medication Sig  ? fenofibrate (TRICOR) 48 MG tablet Take 48 mg by mouth daily.  ? Multiple Vitamins-Minerals (CENTRUM ADULTS) TABS Take by  mouth.  ? [DISCONTINUED] atorvastatin (LIPITOR) 20 MG tablet Take 1 tablet (20 mg total) by mouth daily.  ? [DISCONTINUED] glimepiride (AMARYL) 4 MG tablet Take 1 tablet (4 mg total) by mouth daily before breakfast.  ? [DISCONTINUED] levothyroxine (SYNTHROID, LEVOTHROID) 88 MCG tablet Take 88 mcg by mouth daily before breakfast.  ? [DISCONTINUED] metFORMIN (GLUCOPHAGE) 1000 MG tablet Take 1 tablet (1,000 mg total) by mouth 2 (two) times daily with a meal.  ? [DISCONTINUED] pioglitazone (ACTOS) 45 MG tablet Take 1 tablet (45 mg total) by mouth daily.  ? [DISCONTINUED] tadalafil (CIALIS) 5 MG tablet Take 1 tablet (5 mg total) by mouth daily as needed.  ? atorvastatin (LIPITOR) 20 MG tablet Take 1 tablet (20 mg total) by mouth  daily.  ? glimepiride (AMARYL) 4 MG tablet Take 1 tablet (4 mg total) by mouth daily before breakfast.  ? levothyroxine (SYNTHROID) 88 MCG tablet Take 1 tablet (88 mcg total) by mouth daily before breakfast.  ? metFORMIN (GLUCOPHAGE) 1000 MG tablet Take 1 tablet (1,000 mg total) by mouth 2 (two) times daily with a meal.  ? pioglitazone (ACTOS) 45 MG tablet Take 1 tablet (45 mg total) by mouth daily.  ? tadalafil (CIALIS) 5 MG tablet Take 1 tablet (5 mg total) by mouth daily as needed.  ? ?No facility-administered encounter medications on file as of 01/19/2022.  ? ? ?Follow-up: Return in about 3 months (around 04/21/2022) for physical.  ? ?Tommie Raymond, MD ? ?

## 2022-01-23 ENCOUNTER — Encounter: Payer: 59 | Admitting: Family

## 2022-01-25 ENCOUNTER — Other Ambulatory Visit: Payer: Self-pay | Admitting: Family Medicine

## 2022-01-25 DIAGNOSIS — N5202 Corporo-venous occlusive erectile dysfunction: Secondary | ICD-10-CM

## 2022-03-08 ENCOUNTER — Ambulatory Visit: Payer: 59 | Admitting: Family Medicine

## 2022-04-11 ENCOUNTER — Other Ambulatory Visit: Payer: Self-pay | Admitting: Family Medicine

## 2022-04-11 DIAGNOSIS — N5202 Corporo-venous occlusive erectile dysfunction: Secondary | ICD-10-CM

## 2022-04-12 NOTE — Telephone Encounter (Signed)
Requested medication (s) are due for refill today: Yes  Requested medication (s) are on the active medication list: Yes  Last refill:  01/19/22  Future visit scheduled: Yes  Notes to clinic:  Protocol indicates pt. Needs lab work.    Requested Prescriptions  Pending Prescriptions Disp Refills   tadalafil (CIALIS) 5 MG tablet [Pharmacy Med Name: Tadalafil 5 MG Oral Tablet] 10 tablet 0    Sig: TAKE 1 TABLET BY MOUTH ONCE DAILY AS NEEDED     Urology: Erectile Dysfunction Agents Failed - 04/11/2022  5:05 PM      Failed - AST in normal range and within 360 days    AST  Date Value Ref Range Status  12/28/2008 28 0 - 37 units/L Final         Failed - ALT in normal range and within 360 days    ALT  Date Value Ref Range Status  12/28/2008 49 0 - 53 units/L Final         Failed - Last BP in normal range    BP Readings from Last 1 Encounters:  01/19/22 (!) 145/87         Passed - Valid encounter within last 12 months    Recent Outpatient Visits           2 months ago Type 2 diabetes mellitus with hyperglycemia, without long-term current use of insulin (HCC)   Primary Care at [facility], Lauris Poag, MD   4 months ago Hyperlipidemia associated with type 2 diabetes mellitus [facility])   Primary [facility] at [facility], Gildardo Pounds, NP   5 months ago Hyperlipidemia associated with type 2 diabetes mellitus [facility])   Primary Care at [facility], Gildardo Pounds, NP   5 years ago Encounter for commercial driver medical examination (CDME)   [facility] at [facility], Asencion Partridge, MD   6 years ago Encounter for 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

## 2022-04-17 ENCOUNTER — Other Ambulatory Visit: Payer: Self-pay | Admitting: *Deleted

## 2022-04-17 ENCOUNTER — Other Ambulatory Visit: Payer: Self-pay | Admitting: Family Medicine

## 2022-04-21 ENCOUNTER — Ambulatory Visit (INDEPENDENT_AMBULATORY_CARE_PROVIDER_SITE_OTHER): Payer: 59 | Admitting: Family Medicine

## 2022-04-21 ENCOUNTER — Encounter: Payer: Self-pay | Admitting: Family Medicine

## 2022-04-21 VITALS — BP 139/85 | HR 91 | Temp 98.1°F | Resp 16 | Wt 231.6 lb

## 2022-04-21 DIAGNOSIS — E1165 Type 2 diabetes mellitus with hyperglycemia: Secondary | ICD-10-CM | POA: Diagnosis not present

## 2022-04-21 DIAGNOSIS — E785 Hyperlipidemia, unspecified: Secondary | ICD-10-CM

## 2022-04-21 DIAGNOSIS — E1169 Type 2 diabetes mellitus with other specified complication: Secondary | ICD-10-CM

## 2022-04-21 DIAGNOSIS — N5202 Corporo-venous occlusive erectile dysfunction: Secondary | ICD-10-CM

## 2022-04-21 LAB — POCT GLYCOSYLATED HEMOGLOBIN (HGB A1C): Hemoglobin A1C: 8.9 % — AB (ref 4.0–5.6)

## 2022-04-21 MED ORDER — PIOGLITAZONE HCL 45 MG PO TABS: 45.0000 mg | ORAL_TABLET | Freq: Every day | ORAL | 1 refills | Status: DC

## 2022-04-21 MED ORDER — ATORVASTATIN CALCIUM 20 MG PO TABS: 20.0000 mg | ORAL_TABLET | Freq: Every day | ORAL | 1 refills | Status: DC

## 2022-04-21 MED ORDER — TADALAFIL 5 MG PO TABS: 5.0000 mg | ORAL_TABLET | Freq: Every day | ORAL | 0 refills | Status: DC | PRN

## 2022-04-21 MED ORDER — METFORMIN HCL 1000 MG PO TABS: 1000.0000 mg | ORAL_TABLET | Freq: Two times a day (BID) | ORAL | 1 refills | Status: DC

## 2022-04-21 NOTE — Progress Notes (Signed)
8.69 Patient is here for f/u visit 3 months. Patient would like more of his tadalafil.  Patient has no other concerns today.

## 2022-04-21 NOTE — Progress Notes (Signed)
Established Patient Office Visit  Subjective    Patient ID: Terry Horne, male    DOB: 08/03/1966  Age: 56 y.o. MRN: [id number]  CC:  Chief Complaint  Patient presents with   Follow-up    HPI ARLIS YALE presents for routine follow up of chronic med issues including diabetes.    Outpatient Encounter Medications as of 04/21/2022  Medication Sig   Semaglutide, 1 MG/DOSE, (OZEMPIC, 1 MG/DOSE,) 2 MG/1.5ML SOPN Inject into the skin.   [DISCONTINUED] atorvastatin (LIPITOR) 20 MG tablet Take 1 tablet by mouth daily.   [DISCONTINUED] metFORMIN (GLUCOPHAGE) 1000 MG tablet Take 1 tablet by mouth 2 (two) times daily with a meal.   [DISCONTINUED] tadalafil (CIALIS) 5 MG tablet Take by mouth.   atorvastatin (LIPITOR) 20 MG tablet Take 1 tablet (20 mg total) by mouth daily.   fenofibrate (TRICOR) 48 MG tablet Take 48 mg by mouth daily.   Ferrous Sulfate (IRON PO) Take 1 tablet by mouth daily.   glimepiride (AMARYL) 4 MG tablet Take 1 tablet (4 mg total) by mouth daily before breakfast.   levothyroxine (SYNTHROID) 88 MCG tablet TAKE 1 TABLET(88 MCG) BY MOUTH DAILY BEFORE BREAKFAST   metFORMIN (GLUCOPHAGE) 1000 MG tablet Take 1 tablet (1,000 mg total) by mouth 2 (two) times daily with a meal.   Multiple Vitamins-Minerals (CENTRUM ADULTS) TABS Take by mouth.   pioglitazone (ACTOS) 45 MG tablet Take 1 tablet (45 mg total) by mouth daily.   tadalafil (CIALIS) 5 MG tablet Take 1 tablet (5 mg total) by mouth daily as needed.   [DISCONTINUED] atorvastatin (LIPITOR) 20 MG tablet Take 1 tablet (20 mg total) by mouth daily.   [DISCONTINUED] metFORMIN (GLUCOPHAGE) 1000 MG tablet Take 1 tablet (1,000 mg total) by mouth 2 (two) times daily with a meal.   [DISCONTINUED] pioglitazone (ACTOS) 45 MG tablet Take 1 tablet (45 mg total) by mouth daily.   [DISCONTINUED] tadalafil (CIALIS) 5 MG tablet TAKE 1 TABLET BY MOUTH ONCE DAILY AS NEEDED   No facility-administered encounter medications on file as of  04/21/2022.    Past Medical History:  Diagnosis Date   Diabetes mellitus without complication (HCC)     No past surgical history on file.  No family history on file.  Social History   Socioeconomic History   Marital status: Married    Spouse name: Not on file   Number of children: Not on file   Years of education: Not on file   Highest education level: Not on file  Occupational History   Not on file  Tobacco Use   Smoking status: Former   Smokeless tobacco: Never  Substance and Sexual Activity   Alcohol use: Yes    Alcohol/week: 0.0 standard drinks of alcohol   Drug use: No   Sexual activity: Yes  Other Topics Concern   Not on file  Social History Narrative   Not on file   Social Determinants of Health   Financial Resource Strain: Not on file  Food Insecurity: Not on file  Transportation Needs: Not on file  Physical Activity: Not on file  Stress: Not on file  Social Connections: Not on file  Intimate Partner Violence: Not on file    Review of Systems  All other systems reviewed and are negative.       Objective    BP 139/85   Pulse 91   Temp 98.1 F (36.7 C) (Oral)   Resp 16   Wt 231 lb 9.6 oz (105.1  kg)   SpO2 97%   BMI 29.74 kg/m   Physical Exam Vitals and nursing note reviewed.  Constitutional:      General: He is not in acute distress. Cardiovascular:     Rate and Rhythm: Normal rate and regular rhythm.  Pulmonary:     Effort: Pulmonary effort is normal.     Breath sounds: Normal breath sounds.  Abdominal:     Palpations: Abdomen is soft.     Tenderness: There is no abdominal tenderness.  Musculoskeletal:     Right lower leg: No edema.     Left lower leg: No edema.  Neurological:     General: No focal deficit present.     Mental Status: He is alert and oriented to person, place, and time.         Assessment & Plan:   1. Type 2 diabetes mellitus with hyperglycemia, without long-term current use of insulin (HCC) Increasing  A1c and not at goal. Discussed compliance. Discussed compliance. Continue present management and monitor. - POCT glycosylated hemoglobin (Hb A1C) - Microalbumin / creatinine urine ratio  2. Hyperlipidemia associated with type 2 diabetes mellitus (HCC) Continue present management. Meds refilled.  - atorvastatin (LIPITOR) 20 MG tablet; Take 1 tablet (20 mg total) by mouth daily.  Dispense: 90 tablet; Refill: 1  3. Corporo-venous occlusive erectile dysfunction Med refilled.  - tadalafil (CIALIS) 5 MG tablet; Take 1 tablet (5 mg total) by mouth daily as needed.  Dispense: 30 tablet; Refill: 0    Return in about 3 months (around 07/22/2022) for follow up.   Tommie Raymond, MD

## 2022-04-22 LAB — MICROALBUMIN / CREATININE URINE RATIO
Creatinine, Urine: 32.4 mg/dL
Microalb/Creat Ratio: 34 mg/g creat — ABNORMAL HIGH (ref 0–29)
Microalbumin, Urine: 11 ug/mL

## 2022-06-22 ENCOUNTER — Other Ambulatory Visit: Payer: Self-pay | Admitting: Family Medicine

## 2022-06-22 DIAGNOSIS — N5202 Corporo-venous occlusive erectile dysfunction: Secondary | ICD-10-CM

## 2022-07-19 ENCOUNTER — Other Ambulatory Visit: Payer: Self-pay | Admitting: Family Medicine

## 2022-07-19 DIAGNOSIS — N5202 Corporo-venous occlusive erectile dysfunction: Secondary | ICD-10-CM

## 2022-07-21 ENCOUNTER — Ambulatory Visit (INDEPENDENT_AMBULATORY_CARE_PROVIDER_SITE_OTHER): Payer: Commercial Managed Care - HMO | Admitting: Family Medicine

## 2022-07-21 ENCOUNTER — Encounter: Payer: Self-pay | Admitting: Family Medicine

## 2022-07-21 VITALS — BP 139/90 | HR 76 | Temp 98.1°F | Resp 16 | Wt 233.4 lb

## 2022-07-21 DIAGNOSIS — R030 Elevated blood-pressure reading, without diagnosis of hypertension: Secondary | ICD-10-CM

## 2022-07-21 DIAGNOSIS — E039 Hypothyroidism, unspecified: Secondary | ICD-10-CM | POA: Diagnosis not present

## 2022-07-21 DIAGNOSIS — E1165 Type 2 diabetes mellitus with hyperglycemia: Secondary | ICD-10-CM | POA: Diagnosis not present

## 2022-07-21 DIAGNOSIS — Z23 Encounter for immunization: Secondary | ICD-10-CM | POA: Diagnosis not present

## 2022-07-21 DIAGNOSIS — N5202 Corporo-venous occlusive erectile dysfunction: Secondary | ICD-10-CM

## 2022-07-21 DIAGNOSIS — E1169 Type 2 diabetes mellitus with other specified complication: Secondary | ICD-10-CM | POA: Diagnosis not present

## 2022-07-21 DIAGNOSIS — E785 Hyperlipidemia, unspecified: Secondary | ICD-10-CM

## 2022-07-21 MED ORDER — TADALAFIL 5 MG PO TABS: 5.0000 mg | ORAL_TABLET | Freq: Every day | ORAL | 0 refills | Status: DC | PRN

## 2022-07-21 NOTE — Progress Notes (Signed)
Established Patient Office Visit  Subjective    Patient ID: Terry Horne, male    DOB: 10-Nov-1965  Age: 56 y.o. MRN: [id number]  CC:  Chief Complaint  Patient presents with   Follow-up    HPI Terry Horne presents for routine follow up of chronic med issues. Patient denies acute complaints or concerns.    Outpatient Encounter Medications as of 07/21/2022  Medication Sig   atorvastatin (LIPITOR) 20 MG tablet Take 1 tablet (20 mg total) by mouth daily.   fenofibrate (TRICOR) 48 MG tablet Take 48 mg by mouth daily.   Ferrous Sulfate (IRON PO) Take 1 tablet by mouth daily.   levothyroxine (SYNTHROID) 88 MCG tablet TAKE 1 TABLET(88 MCG) BY MOUTH DAILY BEFORE BREAKFAST   metFORMIN (GLUCOPHAGE) 1000 MG tablet Take 1 tablet (1,000 mg total) by mouth 2 (two) times daily with a meal.   Multiple Vitamins-Minerals (CENTRUM ADULTS) TABS Take by mouth.   pioglitazone (ACTOS) 45 MG tablet Take 1 tablet (45 mg total) by mouth daily.   Semaglutide, 1 MG/DOSE, (OZEMPIC, 1 MG/DOSE,) 2 MG/1.5ML SOPN Inject into the skin.   tadalafil (CIALIS) 5 MG tablet TAKE 1 TABLET BY MOUTH ONCE DAILY AS NEEDED   glimepiride (AMARYL) 4 MG tablet Take 1 tablet (4 mg total) by mouth daily before breakfast.   No facility-administered encounter medications on file as of 07/21/2022.    Past Medical History:  Diagnosis Date   Diabetes mellitus without complication (HCC)     History reviewed. No pertinent surgical history.  History reviewed. No pertinent family history.  Social History   Socioeconomic History   Marital status: Married    Spouse name: Not on file   Number of children: Not on file   Years of education: Not on file   Highest education level: Not on file  Occupational History   Not on file  Tobacco Use   Smoking status: Former   Smokeless tobacco: Never  Substance and Sexual Activity   Alcohol use: Yes    Alcohol/week: 0.0 standard drinks of alcohol   Drug use: No   Sexual  activity: Yes  Other Topics Concern   Not on file  Social History Narrative   Not on file   Social Determinants of Health   Financial Resource Strain: Not on file  Food Insecurity: Not on file  Transportation Needs: Not on file  Physical Activity: Not on file  Stress: Not on file  Social Connections: Not on file  Intimate Partner Violence: Not on file    Review of Systems  All other systems reviewed and are negative.       Objective    BP (!) 139/90   Pulse 76   Temp 98.1 F (36.7 C) (Oral)   Resp 16   Wt 233 lb 6.4 oz (105.9 kg)   SpO2 94%   BMI 29.97 kg/m   Physical Exam Vitals and nursing note reviewed.  Constitutional:      General: He is not in acute distress. Cardiovascular:     Rate and Rhythm: Normal rate and regular rhythm.  Pulmonary:     Effort: Pulmonary effort is normal.     Breath sounds: Normal breath sounds.  Abdominal:     Palpations: Abdomen is soft.     Tenderness: There is no abdominal tenderness.  Musculoskeletal:     Right lower leg: No edema.     Left lower leg: No edema.  Neurological:     General: No focal  deficit present.     Mental Status: He is alert and oriented to person, place, and time.         Assessment & Plan:   1. Type 2 diabetes mellitus with hyperglycemia, without long-term current use of insulin [facility]) Patient referred to [facility] for further eval/mgt.  2. Hyperlipidemia associated with type 2 diabetes mellitus ([place]) Continue and monitor  3. Elevated blood-pressure reading without diagnosis of hypertension Referred to [facility] - consider ARB or ACEI  4. Acquired hypothyroidism Meds refilled  5. Need for immunization against influenza  - Flu Vaccine QUAD 68mo+IM (Fluarix, Fluzone & Alfiuria Quad PF)    No follow-ups on file.   Becky Sax, MD

## 2022-07-25 ENCOUNTER — Other Ambulatory Visit: Payer: Self-pay | Admitting: Family Medicine

## 2022-07-25 ENCOUNTER — Other Ambulatory Visit: Payer: Commercial Managed Care - HMO

## 2022-07-25 DIAGNOSIS — E1165 Type 2 diabetes mellitus with hyperglycemia: Secondary | ICD-10-CM

## 2022-07-25 MED ORDER — TADALAFIL 10 MG PO TABS: 10.0000 mg | ORAL_TABLET | ORAL | 1 refills | Status: DC | PRN

## 2022-07-25 NOTE — Addendum Note (Signed)
Addended by: Melene Plan on: 07/25/2022 08:24 AM   Modules accepted: Orders

## 2022-07-26 LAB — HEMOGLOBIN A1C
Est. average glucose Bld gHb Est-mCnc: 278 mg/dL
Hgb A1c MFr Bld: 11.3 % — ABNORMAL HIGH (ref 4.8–5.6)

## 2022-09-13 ENCOUNTER — Other Ambulatory Visit: Payer: Self-pay | Admitting: *Deleted

## 2022-09-13 MED ORDER — LEVOTHYROXINE SODIUM 88 MCG PO TABS: ORAL_TABLET | ORAL | 0 refills | Status: DC

## 2022-09-28 ENCOUNTER — Other Ambulatory Visit: Payer: Self-pay | Admitting: Family Medicine

## 2022-10-07 ENCOUNTER — Other Ambulatory Visit: Payer: Self-pay | Admitting: Nurse Practitioner

## 2022-10-07 DIAGNOSIS — E1165 Type 2 diabetes mellitus with hyperglycemia: Secondary | ICD-10-CM

## 2022-10-11 NOTE — Telephone Encounter (Signed)
Pt is asking for refill for glimepiride (AMARYL) 4 MG tablet [Pharmacy Med Name: Glimepiride 4 MG Oral Tablet] [[id number]]     Pt is completely out of this  Pharmacy  [facility] [address] ([address] [place] - [address] [place] ([place]) [place] [address] Phone: [phone]  Fax: [phone]

## 2022-10-12 ENCOUNTER — Other Ambulatory Visit: Payer: Self-pay | Admitting: *Deleted

## 2022-10-12 DIAGNOSIS — E1165 Type 2 diabetes mellitus with hyperglycemia: Secondary | ICD-10-CM

## 2022-10-12 MED ORDER — GLIMEPIRIDE 4 MG PO TABS: 4.0000 mg | ORAL_TABLET | Freq: Every day | ORAL | 0 refills | Status: DC

## 2022-10-27 ENCOUNTER — Ambulatory Visit (INDEPENDENT_AMBULATORY_CARE_PROVIDER_SITE_OTHER): Payer: BLUE CROSS/BLUE SHIELD | Admitting: Family Medicine

## 2022-10-27 ENCOUNTER — Encounter: Payer: Self-pay | Admitting: Family Medicine

## 2022-10-27 VITALS — BP 145/93 | HR 83 | Temp 98.1°F | Resp 16 | Ht 74.45 in | Wt 239.0 lb

## 2022-10-27 DIAGNOSIS — E785 Hyperlipidemia, unspecified: Secondary | ICD-10-CM | POA: Diagnosis not present

## 2022-10-27 DIAGNOSIS — E1165 Type 2 diabetes mellitus with hyperglycemia: Secondary | ICD-10-CM | POA: Diagnosis not present

## 2022-10-27 DIAGNOSIS — I10 Essential (primary) hypertension: Secondary | ICD-10-CM

## 2022-10-27 DIAGNOSIS — E1169 Type 2 diabetes mellitus with other specified complication: Secondary | ICD-10-CM

## 2022-10-27 DIAGNOSIS — E039 Hypothyroidism, unspecified: Secondary | ICD-10-CM

## 2022-10-27 LAB — POCT GLYCOSYLATED HEMOGLOBIN (HGB A1C): Hemoglobin A1C: 9 % — AB (ref 4.0–5.6)

## 2022-10-27 MED ORDER — METFORMIN HCL 1000 MG PO TABS: 1000.0000 mg | ORAL_TABLET | Freq: Two times a day (BID) | ORAL | 1 refills | Status: DC

## 2022-10-27 MED ORDER — PIOGLITAZONE HCL 45 MG PO TABS: 45.0000 mg | ORAL_TABLET | Freq: Every day | ORAL | 1 refills | Status: DC

## 2022-10-27 MED ORDER — ATORVASTATIN CALCIUM 20 MG PO TABS: 20.0000 mg | ORAL_TABLET | Freq: Every day | ORAL | 1 refills | Status: DC

## 2022-10-27 MED ORDER — LOSARTAN POTASSIUM 50 MG PO TABS: 50.0000 mg | ORAL_TABLET | Freq: Every day | ORAL | 1 refills | Status: DC

## 2022-10-27 MED ORDER — LEVOTHYROXINE SODIUM 88 MCG PO TABS: ORAL_TABLET | ORAL | 1 refills | Status: DC

## 2022-10-27 MED ORDER — TADALAFIL 10 MG PO TABS: ORAL_TABLET | ORAL | 1 refills | Status: DC

## 2022-10-27 MED ORDER — GLIMEPIRIDE 4 MG PO TABS: 4.0000 mg | ORAL_TABLET | Freq: Every day | ORAL | 1 refills | Status: DC

## 2022-10-27 MED ORDER — FENOFIBRATE 48 MG PO TABS: 48.0000 mg | ORAL_TABLET | Freq: Every day | ORAL | 1 refills | Status: DC

## 2022-10-27 NOTE — Progress Notes (Signed)
Established Patient Office Visit  Subjective    Patient ID: Terry Horne, male    DOB: 1966/07/06  Age: 57 y.o. MRN: [id number]  CC: No chief complaint on file.   HPI ZEKIEL FERRELLI presents to follow up of chronic med issues including diabetes and hypertension. Patient denies acute complaints or concerns.    Outpatient Encounter Medications as of 10/27/2022  Medication Sig   atorvastatin (LIPITOR) 20 MG tablet Take 1 tablet (20 mg total) by mouth daily.   fenofibrate (TRICOR) 48 MG tablet Take 1 tablet (48 mg total) by mouth daily.   Ferrous Sulfate (IRON PO) Take 1 tablet by mouth daily.   glimepiride (AMARYL) 4 MG tablet Take 1 tablet (4 mg total) by mouth daily before breakfast.   levothyroxine (SYNTHROID) 88 MCG tablet TAKE 1 TABLET(88 MCG) BY MOUTH DAILY BEFORE BREAKFAST   losartan (COZAAR) 50 MG tablet Take 1 tablet (50 mg total) by mouth daily.   metFORMIN (GLUCOPHAGE) 1000 MG tablet Take 1 tablet (1,000 mg total) by mouth 2 (two) times daily with a meal.   Multiple Vitamins-Minerals (CENTRUM ADULTS) TABS Take by mouth.   pioglitazone (ACTOS) 45 MG tablet Take 1 tablet (45 mg total) by mouth daily.   Semaglutide, 1 MG/DOSE, (OZEMPIC, 1 MG/DOSE,) 2 MG/1.5ML SOPN Inject into the skin.   tadalafil (CIALIS) 10 MG tablet TAKE 1 TABLET by mouth q3days prn erectile dysfunction   tadalafil (CIALIS) 5 MG tablet Take 1 tablet (5 mg total) by mouth daily as needed.   [DISCONTINUED] atorvastatin (LIPITOR) 20 MG tablet Take 1 tablet (20 mg total) by mouth daily.   [DISCONTINUED] fenofibrate (TRICOR) 48 MG tablet Take 48 mg by mouth daily.   [DISCONTINUED] glimepiride (AMARYL) 4 MG tablet Take 1 tablet (4 mg total) by mouth daily before breakfast.   [DISCONTINUED] levothyroxine (SYNTHROID) 88 MCG tablet TAKE 1 TABLET(88 MCG) BY MOUTH DAILY BEFORE BREAKFAST   [DISCONTINUED] metFORMIN (GLUCOPHAGE) 1000 MG tablet Take 1 tablet (1,000 mg total) by mouth 2 (two) times daily with a meal.    [DISCONTINUED] pioglitazone (ACTOS) 45 MG tablet Take 1 tablet (45 mg total) by mouth daily.   [DISCONTINUED] tadalafil (CIALIS) 10 MG tablet TAKE 1 TABLET BY MOUTH EVERY OTHER DAY AS NEEDED FOR ERECTILE DYSFUNCTION   No facility-administered encounter medications on file as of 10/27/2022.    Past Medical History:  Diagnosis Date   Diabetes mellitus without complication (HCC)     History reviewed. No pertinent surgical history.  History reviewed. No pertinent family history.  Social History   Socioeconomic History   Marital status: Married    Spouse name: Not on file   Number of children: Not on file   Years of education: Not on file   Highest education level: Not on file  Occupational History   Not on file  Tobacco Use   Smoking status: Former   Smokeless tobacco: Never  Substance and Sexual Activity   Alcohol use: Yes    Alcohol/week: 0.0 standard drinks of alcohol   Drug use: No   Sexual activity: Yes  Other Topics Concern   Not on file  Social History Narrative   Not on file   Social Determinants of Health   Financial Resource Strain: Not on file  Food Insecurity: Not on file  Transportation Needs: Not on file  Physical Activity: Not on file  Stress: Not on file  Social Connections: Not on file  Intimate Partner Violence: Not on file    Review  of Systems  All other systems reviewed and are negative.       Objective    BP (!) 145/93   Pulse 83   Temp 98.1 F (36.7 C) (Oral)   Resp 16   Ht 6' 2.45" (1.891 m)   Wt 239 lb (108.4 kg)   SpO2 95%   BMI 30.32 kg/m   Physical Exam Vitals and nursing note reviewed.  Constitutional:      General: He is not in acute distress. Cardiovascular:     Rate and Rhythm: Normal rate and regular rhythm.  Pulmonary:     Effort: Pulmonary effort is normal.     Breath sounds: Normal breath sounds.  Abdominal:     Palpations: Abdomen is soft.     Tenderness: There is no abdominal tenderness.  Musculoskeletal:      Right lower leg: No edema.     Left lower leg: No edema.  Neurological:     General: No focal deficit present.     Mental Status: He is alert and oriented to person, place, and time.         Assessment & Plan:   1. Type 2 diabetes mellitus with hyperglycemia, without long-term current use of insulin (HCC) Improved A1c but not at goal. Continue and monitor - POCT glycosylated hemoglobin (Hb A1C) - glimepiride (AMARYL) 4 MG tablet; Take 1 tablet (4 mg total) by mouth daily before breakfast.  Dispense: 90 tablet; Refill: 1  2. Essential hypertension Patient with elevated without meds. Losartan 50 mg prescribed. monitor  3. Hyperlipidemia associated with type 2 diabetes mellitus (HCC) continue - atorvastatin (LIPITOR) 20 MG tablet; Take 1 tablet (20 mg total) by mouth daily.  Dispense: 90 tablet; Refill: 1  4. Acquired hypothyroidism Appears stable. Meds refilled  Return in about 3 months (around 01/26/2023) for follow up.   Tommie Raymond, MD

## 2022-10-27 NOTE — Progress Notes (Unsigned)
Patient is here for their 3 month follow-up Patient has no concerns today Care gaps have been discussed with patient  

## 2022-11-01 ENCOUNTER — Other Ambulatory Visit: Payer: Self-pay | Admitting: Family Medicine

## 2022-11-01 NOTE — Telephone Encounter (Signed)
Requested Prescriptions  Pending Prescriptions Disp Refills   tadalafil (CIALIS) 10 MG tablet [Pharmacy Med Name: Tadalafil 10 MG Oral Tablet] 10 tablet 0    Sig: TAKE 1 TABLET BY MOUTH EVERY OTHER DAY AS NEEDED FOR ERECTILE DYSFUNCTION     Urology: Erectile Dysfunction Agents Failed - 11/01/2022  1:13 PM      Failed - AST in normal range and within 360 days    AST  Date Value Ref Range Status  12/28/2008 28 0 - 37 units/L Final         Failed - ALT in normal range and within 360 days    ALT  Date Value Ref Range Status  12/28/2008 49 0 - 53 units/L Final         Failed - Last BP in normal range    BP Readings from Last 1 Encounters:  10/27/22 (!) 145/93         Passed - Valid encounter within last 12 months    Recent Outpatient Visits           5 days ago Type 2 diabetes mellitus with hyperglycemia, without long-term current use of insulin ([place])   Primary Care at [facility], MD   3 months ago Type 2 diabetes mellitus with hyperglycemia, without long-term current use of insulin [facility])   Primary Care at [facility], Clyde Canterbury, MD   6 months ago Type 2 diabetes mellitus with hyperglycemia, without long-term current use of insulin [facility])   Primary Care at [facility], Clyde Canterbury, MD   9 months ago Type 2 diabetes mellitus with hyperglycemia, without long-term current use of insulin [facility])   Primary Care at [facility], Clyde Canterbury, MD   10 months ago Hyperlipidemia associated with type 2 diabetes mellitus [facility])   Primary Care at [facility], Kriste Basque, NP       Future Appointments             In 2 months Dorna Mai, MD Primary Care at [facility]

## 2022-12-11 ENCOUNTER — Other Ambulatory Visit: Payer: Self-pay | Admitting: Family Medicine

## 2023-01-26 ENCOUNTER — Ambulatory Visit: Payer: BLUE CROSS/BLUE SHIELD | Admitting: Family Medicine

## 2023-02-02 ENCOUNTER — Ambulatory Visit (INDEPENDENT_AMBULATORY_CARE_PROVIDER_SITE_OTHER): Payer: BLUE CROSS/BLUE SHIELD | Admitting: Family Medicine

## 2023-02-02 ENCOUNTER — Encounter: Payer: Self-pay | Admitting: Family Medicine

## 2023-02-02 VITALS — BP 135/81 | HR 82 | Temp 98.1°F | Resp 16 | Wt 240.6 lb

## 2023-02-02 DIAGNOSIS — E1165 Type 2 diabetes mellitus with hyperglycemia: Secondary | ICD-10-CM

## 2023-02-02 DIAGNOSIS — E785 Hyperlipidemia, unspecified: Secondary | ICD-10-CM

## 2023-02-02 DIAGNOSIS — I10 Essential (primary) hypertension: Secondary | ICD-10-CM

## 2023-02-02 DIAGNOSIS — E1169 Type 2 diabetes mellitus with other specified complication: Secondary | ICD-10-CM

## 2023-02-02 LAB — POCT GLYCOSYLATED HEMOGLOBIN (HGB A1C): Hemoglobin A1C: 9.1 % — AB (ref 4.0–5.6)

## 2023-02-02 NOTE — Progress Notes (Unsigned)
Patient is here for their 3 month follow-up Patient has no concerns today Care gaps have been discussed with patient  

## 2023-02-03 LAB — BASIC METABOLIC PANEL
BUN/Creatinine Ratio: 8 — ABNORMAL LOW (ref 9–20)
BUN: 10 mg/dL (ref 6–24)
CO2: 18 mmol/L — ABNORMAL LOW (ref 20–29)
Calcium: 9.9 mg/dL (ref 8.7–10.2)
Chloride: 104 mmol/L (ref 96–106)
Creatinine, Ser: 1.25 mg/dL (ref 0.76–1.27)
Glucose: 157 mg/dL — ABNORMAL HIGH (ref 70–99)
Potassium: 4.4 mmol/L (ref 3.5–5.2)
Sodium: 141 mmol/L (ref 134–144)
eGFR: 67 mL/min/{1.73_m2} (ref >59)

## 2023-02-03 LAB — LIPID PANEL
Chol/HDL Ratio: 2.8 ratio (ref 0.0–5.0)
Cholesterol, Total: 147 mg/dL (ref 100–199)
HDL: 53 mg/dL (ref >39)
LDL Chol Calc (NIH): 74 mg/dL (ref 0–99)
Triglycerides: 112 mg/dL (ref 0–149)
VLDL Cholesterol Cal: 20 mg/dL (ref 5–40)

## 2023-02-03 LAB — AST: AST: 38 IU/L (ref 0–40)

## 2023-02-03 LAB — ALT: ALT: 38 IU/L (ref 0–44)

## 2023-02-07 ENCOUNTER — Encounter: Payer: Self-pay | Admitting: Family Medicine

## 2023-02-07 NOTE — Progress Notes (Signed)
Estblished Patient Office Visit  Subjective    Patient ID: Terry Horne, male    DOB: 24-Sep-1966  Age: 57 y.o. MRN: [id number]  CC: No chief complaint on file.   HPI Terry Horne presents for routine follow up of chronic med issues. Patient denies acute complaints or concerns.    Outpatient Encounter Medications as of 02/02/2023  Medication Sig   atorvastatin (LIPITOR) 20 MG tablet Take 1 tablet (20 mg total) by mouth daily.   fenofibrate (TRICOR) 48 MG tablet Take 1 tablet (48 mg total) by mouth daily.   Ferrous Sulfate (IRON PO) Take 1 tablet by mouth daily.   glimepiride (AMARYL) 4 MG tablet Take 1 tablet (4 mg total) by mouth daily before breakfast.   levothyroxine (SYNTHROID) 88 MCG tablet TAKE 1 TABLET(88 MCG) BY MOUTH DAILY BEFORE BREAKFAST   losartan (COZAAR) 50 MG tablet Take 1 tablet (50 mg total) by mouth daily.   metFORMIN (GLUCOPHAGE) 1000 MG tablet Take 1 tablet (1,000 mg total) by mouth 2 (two) times daily with a meal.   Multiple Vitamins-Minerals (CENTRUM ADULTS) TABS Take by mouth.   pioglitazone (ACTOS) 45 MG tablet Take 1 tablet (45 mg total) by mouth daily.   Semaglutide, 1 MG/DOSE, (OZEMPIC, 1 MG/DOSE,) 2 MG/1.5ML SOPN Inject into the skin.   tadalafil (CIALIS) 10 MG tablet TAKE 1 TABLET by mouth q3days prn erectile dysfunction   tadalafil (CIALIS) 5 MG tablet Take 1 tablet (5 mg total) by mouth daily as needed.   No facility-administered encounter medications on file as of 02/02/2023.    Past Medical History:  Diagnosis Date   Diabetes mellitus without complication     History reviewed. No pertinent surgical history.  History reviewed. No pertinent family history.  Social History   Socioeconomic History   Marital status: Married    Spouse name: Not on file   Number of children: Not on file   Years of education: Not on file   Highest education level: Not on file  Occupational History   Not on file  Tobacco Use   Smoking status: Former    Smokeless tobacco: Never  Substance and Sexual Activity   Alcohol use: Yes    Alcohol/week: 0.0 standard drinks of alcohol   Drug use: No   Sexual activity: Yes  Other Topics Concern   Not on file  Social History Narrative   Not on file   Social Determinants of Health   Financial Resource Strain: Low Risk  (02/02/2023)   Overall Financial Resource Strain (CARDIA)    Difficulty of Paying Living Expenses: Not hard at all  Food Insecurity: No Food Insecurity (02/02/2023)   Hunger Vital Sign    Worried About Running Out of Food in the Last Year: Never true    Ran Out of Food in the Last Year: Never true  Transportation Needs: No Transportation Needs (02/02/2023)   PRAPARE - Administrator, Civil Service (Medical): No    Lack of Transportation (Non-Medical): No  Physical Activity: Sufficiently Active (02/02/2023)   Exercise Vital Sign    Days of Exercise per Week: 7 days    Minutes of Exercise per Session: 30 min  Stress: No Stress Concern Present (02/02/2023)   [name] - Occupational Stress Questionnaire    Feeling of Stress : Not at all  Social Connections: Unknown (02/02/2023)   Social Connection and Isolation Panel [NHANES]    Frequency of Communication with Friends and Family: More than  three times a week    Frequency of Social Gatherings with Friends and Family: More than three times a week    Attends Religious Services: More than 4 times per year    Active Member of [employer] or Organizations: Yes    Attends Banker Meetings: 1 to 4 times per year    Marital Status: Not on file  Intimate Partner Violence: Not At Risk (02/02/2023)   Humiliation, Afraid, Rape, and Kick questionnaire    Fear of Current or Ex-Partner: No    Emotionally Abused: No    Physically Abused: No    Sexually Abused: No    Review of Systems  All other systems reviewed and are negative.       Objective    BP 135/81   Pulse 82   Temp 98.1 F  (36.7 C) (Oral)   Resp 16   Wt 240 lb 9.6 oz (109.1 kg)   SpO2 96%   BMI 30.52 kg/m   Physical Exam Vitals and nursing note reviewed.  Constitutional:      General: He is not in acute distress. Cardiovascular:     Rate and Rhythm: Normal rate and regular rhythm.  Pulmonary:     Effort: Pulmonary effort is normal.     Breath sounds: Normal breath sounds.  Abdominal:     Palpations: Abdomen is soft.     Tenderness: There is no abdominal tenderness.  Musculoskeletal:     Right lower leg: No edema.     Left lower leg: No edema.  Neurological:     General: No focal deficit present.     Mental Status: He is alert and oriented to person, place, and time.         Assessment & Plan:   1. Type 2 diabetes mellitus with hyperglycemia, without long-term current use of insulin A1c is slightly increased and above goal. Discussed compliance.  - HM DIABETES FOOT EXAM - POCT glycosylated hemoglobin (Hb A1C) - Basic metabolic panel - Lipid Panel - AST - ALT  2. Essential hypertension Appears stable. Continue. Meds refilled. Monitoring labs ordered - Basic metabolic panel - Lipid Panel - AST - ALT  3. Hyperlipidemia associated with type 2 diabetes mellitus Continue. Monitoring labs ordered - Lipid Panel - AST - ALT    Return in about 3 months (around 05/04/2023) for follow up.   Tommie Raymond, MD

## 2023-04-03 ENCOUNTER — Other Ambulatory Visit: Payer: Self-pay | Admitting: Family Medicine

## 2023-04-05 NOTE — Telephone Encounter (Signed)
Requested Prescriptions  Pending Prescriptions Disp Refills   tadalafil (CIALIS) 10 MG tablet [Pharmacy Med Name: Tadalafil 10 MG Oral Tablet] 30 tablet 0    Sig: TAKE 1 TABLET BY MOUTH AS NEEDED FOR ERECTILE DYSFUNCTION     Urology: Erectile Dysfunction Agents Passed - 04/03/2023  5:12 PM      Passed - AST in normal range and within 360 days    AST  Date Value Ref Range Status  02/02/2023 38 0 - 40 IU/L Final         Passed - ALT in normal range and within 360 days    ALT  Date Value Ref Range Status  02/02/2023 38 0 - 44 IU/L Final         Passed - Last BP in normal range    BP Readings from Last 1 Encounters:  02/02/23 135/81         Passed - Valid encounter within last 12 months    Recent Outpatient Visits           2 months ago Type 2 diabetes mellitus with hyperglycemia, without long-term current use of insulin (HCC)   [place] [facility] at [facility], Lauris Poag, MD   5 months ago Type 2 diabetes mellitus with hyperglycemia, without long-term current use of insulin (HCC)   [place] [facility] at [facility], Lauris Poag, MD   8 months ago Type 2 diabetes mellitus with hyperglycemia, without long-term current use of insulin [facility])   [place] Primary Care at [facility], Lauris Poag, MD   11 months ago Type 2 diabetes mellitus with hyperglycemia, without long-term current use of insulin (HCC)   [place] [facility] at [facility], Lauris Poag, MD   1 year ago Type 2 diabetes mellitus with hyperglycemia, without long-term current use of insulin [facility])   [place] [facility] at [facility], MD       Future Appointments             In 4 weeks Georganna Skeans, MD [facility] at [facility]

## 2023-04-24 ENCOUNTER — Other Ambulatory Visit: Payer: Self-pay | Admitting: Family Medicine

## 2023-04-24 DIAGNOSIS — E1165 Type 2 diabetes mellitus with hyperglycemia: Secondary | ICD-10-CM

## 2023-04-26 ENCOUNTER — Other Ambulatory Visit: Payer: Self-pay | Admitting: Family Medicine

## 2023-04-27 NOTE — Telephone Encounter (Signed)
Requested medication (s) are due for refill today: yes  Requested medication (s) are on the active medication list: yes  Last refill:  10/27/22 #90 1 RF  Future visit scheduled: yes  Notes to clinic:  last lab work 2009   Requested Prescriptions  Pending Prescriptions Disp Refills   fenofibrate (TRICOR) 48 MG tablet [Pharmacy Med Name: Fenofibrate 48 MG Oral Tablet] 90 tablet 0    Sig: Take 1 tablet by mouth once daily     Cardiovascular:  Antilipid - Fibric Acid Derivatives Failed - 04/26/2023  6:50 AM      Failed - HGB in normal range and within 360 days    Hemoglobin  Date Value Ref Range Status  09/14/2008 14.2 13.0 - 17.0 g/dL Final         Failed - HCT in normal range and within 360 days    HCT  Date Value Ref Range Status  09/14/2008 43.0 39.0 - 52.0 % Final         Failed - PLT in normal range and within 360 days    Platelets  Date Value Ref Range Status  09/14/2008 216 150 - 400 K/uL Final         Failed - WBC in normal range and within 360 days    WBC  Date Value Ref Range Status  09/14/2008 7.7 4.0 - 10.5 10*3/microliter Final         Failed - Lipid Panel in normal range within the last 12 months    Cholesterol, Total  Date Value Ref Range Status  02/02/2023 147 100 - 199 mg/dL Final   LDL Chol Calc (NIH)  Date Value Ref Range Status  02/02/2023 74 0 - 99 mg/dL Final   HDL  Date Value Ref Range Status  02/02/2023 53 >39 mg/dL Final   Triglycerides  Date Value Ref Range Status  02/02/2023 112 0 - 149 mg/dL Final         Passed - ALT in normal range and within 360 days    ALT  Date Value Ref Range Status  02/02/2023 38 0 - 44 IU/L Final         Passed - AST in normal range and within 360 days    AST  Date Value Ref Range Status  02/02/2023 38 0 - 40 IU/L Final         Passed - Cr in normal range and within 360 days    Creatinine, Ser  Date Value Ref Range Status  02/02/2023 1.25 0.76 - 1.27 mg/dL Final         Passed - eGFR is 30 or  above and within 360 days    eGFR  Date Value Ref Range Status  02/02/2023 67 >59 mL/min/1.73 Final         Passed - Valid encounter within last 12 months    Recent Outpatient Visits           2 months ago Type 2 diabetes mellitus with hyperglycemia, without long-term current use of insulin (HCC)   [place] [facility], Lauris Poag, MD   6 months ago Type 2 diabetes mellitus with hyperglycemia, without long-term current use of insulin (HCC)   [place] [facility], Lauris Poag, MD   9 months ago Type 2 diabetes mellitus with hyperglycemia, without long-term current use of insulin (HCC)   [place] [facility] at [facility], MD   1 year ago Type  2 diabetes mellitus with hyperglycemia, without long-term current use of insulin [facility])   [place] [facility] at [facility], Lauris Poag, MD   1 year ago Type 2 diabetes mellitus with hyperglycemia, without long-term current use of insulin [facility])   [place] [facility] at [facility], MD       Future Appointments             In 1 week Georganna Skeans, MD [facility] at [facility]

## 2023-05-04 ENCOUNTER — Ambulatory Visit: Payer: BLUE CROSS/BLUE SHIELD | Admitting: Family Medicine

## 2023-05-19 ENCOUNTER — Other Ambulatory Visit: Payer: Self-pay | Admitting: Family Medicine

## 2023-05-19 DIAGNOSIS — E1169 Type 2 diabetes mellitus with other specified complication: Secondary | ICD-10-CM

## 2023-05-21 ENCOUNTER — Other Ambulatory Visit: Payer: Self-pay | Admitting: Family Medicine

## 2023-05-21 DIAGNOSIS — E1165 Type 2 diabetes mellitus with hyperglycemia: Secondary | ICD-10-CM

## 2023-05-21 NOTE — Telephone Encounter (Signed)
Requested Prescriptions  Pending Prescriptions Disp Refills   atorvastatin (LIPITOR) 20 MG tablet [Pharmacy Med Name: Atorvastatin Calcium 20 MG Oral Tablet] 90 tablet 0    Sig: Take 1 tablet by mouth once daily     Cardiovascular:  Antilipid - Statins Failed - 05/19/2023  6:50 AM      Failed - Lipid Panel in normal range within the last 12 months    Cholesterol, Total  Date Value Ref Range Status  02/02/2023 147 100 - 199 mg/dL Final   LDL Chol Calc (NIH)  Date Value Ref Range Status  02/02/2023 74 0 - 99 mg/dL Final   HDL  Date Value Ref Range Status  02/02/2023 53 >39 mg/dL Final   Triglycerides  Date Value Ref Range Status  02/02/2023 112 0 - 149 mg/dL Final         Passed - Patient is not pregnant      Passed - Valid encounter within last 12 months    Recent Outpatient Visits           3 months ago Type 2 diabetes mellitus with hyperglycemia, without long-term current use of insulin (HCC)   [place] [facility] at [facility], Lauris Poag, MD   6 months ago Type 2 diabetes mellitus with hyperglycemia, without long-term current use of insulin (HCC)   [place] [facility] at [facility], Lauris Poag, MD   10 months ago Type 2 diabetes mellitus with hyperglycemia, without long-term current use of insulin (HCC)   [place] [facility] at [facility], Lauris Poag, MD   1 year ago Type 2 diabetes mellitus with hyperglycemia, without long-term current use of insulin [facility])   [place] [facility] at [facility], Lauris Poag, MD   1 year ago Type 2 diabetes mellitus with hyperglycemia, without long-term current use of insulin [facility])   [place] [facility] at [facility], MD       Future Appointments             In 3 days Georganna Skeans, MD [facility] at [facility]

## 2023-05-24 ENCOUNTER — Ambulatory Visit: Payer: BLUE CROSS/BLUE SHIELD | Admitting: Family Medicine

## 2023-06-04 ENCOUNTER — Ambulatory Visit (INDEPENDENT_AMBULATORY_CARE_PROVIDER_SITE_OTHER): Payer: BLUE CROSS/BLUE SHIELD | Admitting: Family Medicine

## 2023-06-04 ENCOUNTER — Encounter: Payer: Self-pay | Admitting: Family Medicine

## 2023-06-04 VITALS — BP 148/80 | HR 79 | Temp 98.1°F | Resp 16 | Wt 245.0 lb

## 2023-06-04 DIAGNOSIS — E785 Hyperlipidemia, unspecified: Secondary | ICD-10-CM | POA: Diagnosis not present

## 2023-06-04 DIAGNOSIS — I10 Essential (primary) hypertension: Secondary | ICD-10-CM | POA: Diagnosis not present

## 2023-06-04 DIAGNOSIS — Z7984 Long term (current) use of oral hypoglycemic drugs: Secondary | ICD-10-CM

## 2023-06-04 DIAGNOSIS — Z7985 Long-term (current) use of injectable non-insulin antidiabetic drugs: Secondary | ICD-10-CM

## 2023-06-04 DIAGNOSIS — E039 Hypothyroidism, unspecified: Secondary | ICD-10-CM

## 2023-06-04 DIAGNOSIS — E1169 Type 2 diabetes mellitus with other specified complication: Secondary | ICD-10-CM | POA: Diagnosis not present

## 2023-06-04 DIAGNOSIS — E1165 Type 2 diabetes mellitus with hyperglycemia: Secondary | ICD-10-CM

## 2023-06-04 LAB — POCT GLYCOSYLATED HEMOGLOBIN (HGB A1C): Hemoglobin A1C: 8.5 % — AB (ref 4.0–5.6)

## 2023-06-04 MED ORDER — GLIMEPIRIDE 4 MG PO TABS
4.0000 mg | ORAL_TABLET | Freq: Every day | ORAL | 3 refills | Status: DC
Start: 2023-06-04 — End: 2024-06-09

## 2023-06-04 NOTE — Progress Notes (Signed)
Established Patient Office Visit  Subjective    Patient ID: Terry Horne, male    DOB: Jun 10, 1966  Age: 57 y.o. MRN: [id number]  CC: No chief complaint on file.   HPI Terry Horne presents for routine follow up of chronic med issues. Patient denies acute complaints or concerns.    Outpatient Encounter Medications as of 06/04/2023  Medication Sig   atorvastatin (LIPITOR) 20 MG tablet Take 1 tablet by mouth once daily   fenofibrate (TRICOR) 48 MG tablet Take 1 tablet by mouth once daily   Ferrous Sulfate (IRON PO) Take 1 tablet by mouth daily.   levothyroxine (SYNTHROID) 88 MCG tablet TAKE 1 TABLET BY MOUTH ONCE DAILY BEFORE BREAKFAST   losartan (COZAAR) 50 MG tablet Take 1 tablet by mouth once daily   Multiple Vitamins-Minerals (CENTRUM ADULTS) TABS Take by mouth.   Semaglutide, 1 MG/DOSE, (OZEMPIC, 1 MG/DOSE,) 2 MG/1.5ML SOPN Inject into the skin.   tadalafil (CIALIS) 10 MG tablet TAKE 1 TABLET BY MOUTH AS NEEDED FOR ERECTILE DYSFUNCTION   tadalafil (CIALIS) 5 MG tablet Take 1 tablet (5 mg total) by mouth daily as needed.   Testosterone Undecanoate (JATENZO) 237 MG CAPS Take by mouth.   [DISCONTINUED] glimepiride (AMARYL) 4 MG tablet TAKE 1 TABLET BY MOUTH ONCE DAILY BEFORE BREAKFAST . APPOINTMENT REQUIRED FOR FUTURE REFILLS   glimepiride (AMARYL) 4 MG tablet Take 1 tablet (4 mg total) by mouth daily with breakfast.   metFORMIN (GLUCOPHAGE) 1000 MG tablet Take 1 tablet (1,000 mg total) by mouth 2 (two) times daily with a meal.   pioglitazone (ACTOS) 45 MG tablet Take 1 tablet (45 mg total) by mouth daily.   No facility-administered encounter medications on file as of 06/04/2023.    Past Medical History:  Diagnosis Date   Diabetes mellitus without complication (HCC)     History reviewed. No pertinent surgical history.  History reviewed. No pertinent family history.  Social History   Socioeconomic History   Marital status: Married    Spouse name: Not on file    Number of children: Not on file   Years of education: Not on file   Highest education level: Not on file  Occupational History   Not on file  Tobacco Use   Smoking status: Former   Smokeless tobacco: Never  Substance and Sexual Activity   Alcohol use: Yes    Alcohol/week: 0.0 standard drinks of alcohol   Drug use: No   Sexual activity: Yes  Other Topics Concern   Not on file  Social History Narrative   Not on file   Social Determinants of Health   Financial Resource Strain: Low Risk  (02/02/2023)   Overall Financial Resource Strain (CARDIA)    Difficulty of Paying Living Expenses: Not hard at all  Food Insecurity: No Food Insecurity (02/02/2023)   Hunger Vital Sign    Worried About Running Out of Food in the Last Year: Never true    Ran Out of Food in the Last Year: Never true  Transportation Needs: No Transportation Needs (02/02/2023)   PRAPARE - Administrator, Civil Service (Medical): No    Lack of Transportation (Non-Medical): No  Physical Activity: Sufficiently Active (02/02/2023)   Exercise Vital Sign    Days of Exercise per Week: 7 days    Minutes of Exercise per Session: 30 min  Stress: No Stress Concern Present (02/02/2023)   [name] - Occupational Stress Questionnaire    Feeling of  Stress : Not at all  Social Connections: Unknown (02/02/2023)   Social Connection and Isolation Panel [NHANES]    Frequency of Communication with Friends and Family: More than three times a week    Frequency of Social Gatherings with Friends and Family: More than three times a week    Attends Religious Services: More than 4 times per year    Active Member of [employer] or Organizations: Yes    Attends Banker Meetings: 1 to 4 times per year    Marital Status: Not on file  Intimate Partner Violence: Not At Risk (02/02/2023)   Humiliation, Afraid, Rape, and Kick questionnaire    Fear of Current or Ex-Partner: No    Emotionally Abused: No     Physically Abused: No    Sexually Abused: No    Review of Systems  All other systems reviewed and are negative.       Objective    BP (!) 148/80   Pulse 79   Temp 98.1 F (36.7 C) (Oral)   Resp 16   Wt 245 lb (111.1 kg)   SpO2 95%   BMI 31.08 kg/m   Physical Exam Vitals and nursing note reviewed.  Constitutional:      General: He is not in acute distress. Cardiovascular:     Rate and Rhythm: Normal rate and regular rhythm.  Pulmonary:     Effort: Pulmonary effort is normal.     Breath sounds: Normal breath sounds.  Abdominal:     Palpations: Abdomen is soft.     Tenderness: There is no abdominal tenderness.  Musculoskeletal:     Right lower leg: No edema.     Left lower leg: No edema.  Neurological:     General: No focal deficit present.     Mental Status: He is alert and oriented to person, place, and time.         Assessment & Plan:   1. Type 2 diabetes mellitus with hyperglycemia, without long-term current use of insulin (HCC) Decreased A1c but not at goal - POCT glycosylated hemoglobin (Hb A1C) - glimepiride (AMARYL) 4 MG tablet; Take 1 tablet (4 mg total) by mouth daily with breakfast.  Dispense: 90 tablet; Refill: 3  2. Essential hypertension Continue   3. Hyperlipidemia associated with type 2 diabetes mellitus (HCC) Continue   4. Acquired hypothyroidism Appears stable.continue     Return in about 3 months (around 09/04/2023) for follow up, chronic med issues.   Tommie Raymond, MD

## 2023-06-04 NOTE — Progress Notes (Signed)
Patient is here for their 4 month follow-up Patient has no concerns today Care gaps have been discussed with patient  

## 2023-06-19 ENCOUNTER — Other Ambulatory Visit: Payer: Self-pay | Admitting: Family Medicine

## 2023-07-09 ENCOUNTER — Other Ambulatory Visit: Payer: Self-pay | Admitting: Family Medicine

## 2023-07-21 ENCOUNTER — Other Ambulatory Visit: Payer: Self-pay | Admitting: Family Medicine

## 2023-07-24 ENCOUNTER — Other Ambulatory Visit: Payer: Self-pay | Admitting: Family Medicine

## 2023-07-24 ENCOUNTER — Other Ambulatory Visit: Payer: Self-pay

## 2023-07-24 NOTE — Telephone Encounter (Signed)
Requested medications are due for refill today.  yes  Requested medications are on the active medications list.  yes  Last refill. 04/27/2023 #90 0 rf  Future visit scheduled.   yes  Notes to clinic.  Labs are expired.    Requested Prescriptions  Pending Prescriptions Disp Refills   fenofibrate (TRICOR) 48 MG tablet [Pharmacy Med Name: Fenofibrate 48 MG Oral Tablet] 90 tablet 0    Sig: Take 1 tablet by mouth once daily     Cardiovascular:  Antilipid - Fibric Acid Derivatives Failed - 07/24/2023  6:51 AM      Failed - HGB in normal range and within 360 days    Hemoglobin  Date Value Ref Range Status  09/14/2008 14.2 13.0 - 17.0 g/dL Final         Failed - HCT in normal range and within 360 days    HCT  Date Value Ref Range Status  09/14/2008 43.0 39.0 - 52.0 % Final         Failed - PLT in normal range and within 360 days    Platelets  Date Value Ref Range Status  09/14/2008 216 150 - 400 K/uL Final         Failed - WBC in normal range and within 360 days    WBC  Date Value Ref Range Status  09/14/2008 7.7 4.0 - 10.5 10*3/microliter Final         Failed - Lipid Panel in normal range within the last 12 months    Cholesterol, Total  Date Value Ref Range Status  02/02/2023 147 100 - 199 mg/dL Final   LDL Chol Calc (NIH)  Date Value Ref Range Status  02/02/2023 74 0 - 99 mg/dL Final   HDL  Date Value Ref Range Status  02/02/2023 53 >39 mg/dL Final   Triglycerides  Date Value Ref Range Status  02/02/2023 112 0 - 149 mg/dL Final         Passed - ALT in normal range and within 360 days    ALT  Date Value Ref Range Status  02/02/2023 38 0 - 44 IU/L Final         Passed - AST in normal range and within 360 days    AST  Date Value Ref Range Status  02/02/2023 38 0 - 40 IU/L Final         Passed - Cr in normal range and within 360 days    Creatinine, Ser  Date Value Ref Range Status  02/02/2023 1.25 0.76 - 1.27 mg/dL Final         Passed - eGFR is 30  or above and within 360 days    eGFR  Date Value Ref Range Status  02/02/2023 67 >59 mL/min/1.73 Final         Passed - Valid encounter within last 12 months    Recent Outpatient Visits           1 month ago Essential hypertension   [place] [facility] at [facility], Lauris Poag, MD   5 months ago Type 2 diabetes mellitus with hyperglycemia, without long-term current use of insulin (HCC)   [place] [facility] at [facility], Lauris Poag, MD   9 months ago Type 2 diabetes mellitus with hyperglycemia, without long-term current use of insulin [facility])   [place] [facility] at [facility], Lauris Poag, MD   1 year ago Type 2 diabetes mellitus with hyperglycemia, without long-term current use of  insulin [facility])   [place] [facility] at [facility], Lauris Poag, MD   1 year ago Type 2 diabetes mellitus with hyperglycemia, without long-term current use of insulin [facility])   [place] [facility] at [facility], MD       Future Appointments             In 1 month Georganna Skeans, MD [facility] at [facility]

## 2023-08-01 ENCOUNTER — Other Ambulatory Visit: Payer: Self-pay | Admitting: Family Medicine

## 2023-08-01 NOTE — Telephone Encounter (Signed)
Requested Prescriptions  Pending Prescriptions Disp Refills   pioglitazone (ACTOS) 45 MG tablet [Pharmacy Med Name: PIOGLITAZONE 45MG  TAB] 90 tablet 0    Sig: Take 1 tablet by mouth once daily     Endocrinology:  Diabetes - Glitazones - pioglitazone Failed - 08/01/2023  6:50 AM      Failed - HBA1C is between 0 and 7.9 and within 180 days    Hemoglobin A1C  Date Value Ref Range Status  06/04/2023 8.5 (A) 4.0 - 5.6 % Final   Hgb A1c MFr Bld  Date Value Ref Range Status  07/25/2022 11.3 (H) 4.8 - 5.6 % Final    Comment:             Prediabetes: 5.7 - 6.4          Diabetes: >6.4          Glycemic control for adults with diabetes: <7.0          Passed - Valid encounter within last 6 months    Recent Outpatient Visits           1 month ago Essential hypertension   [place] [facility] at [facility], Lauris Poag, MD   6 months ago Type 2 diabetes mellitus with hyperglycemia, without long-term current use of insulin (HCC)   [place] [facility] at [facility], Lauris Poag, MD   9 months ago Type 2 diabetes mellitus with hyperglycemia, without long-term current use of insulin (HCC)   [place] [facility] at [facility], Lauris Poag, MD   1 year ago Type 2 diabetes mellitus with hyperglycemia, without long-term current use of insulin [facility])   [place] [facility] at [facility], Lauris Poag, MD   1 year ago Type 2 diabetes mellitus with hyperglycemia, without long-term current use of insulin [facility])   [place] [facility] at [facility], MD       Future Appointments             In 1 month Georganna Skeans, MD [facility] at [facility]

## 2023-08-21 ENCOUNTER — Other Ambulatory Visit: Payer: Self-pay

## 2023-08-21 ENCOUNTER — Other Ambulatory Visit: Payer: Self-pay | Admitting: Family Medicine

## 2023-08-21 DIAGNOSIS — E1169 Type 2 diabetes mellitus with other specified complication: Secondary | ICD-10-CM

## 2023-08-21 MED ORDER — ATORVASTATIN CALCIUM 20 MG PO TABS: 20.0000 mg | ORAL_TABLET | Freq: Every day | ORAL | 0 refills | Status: DC

## 2023-09-04 ENCOUNTER — Encounter: Payer: Self-pay | Admitting: Family Medicine

## 2023-09-04 ENCOUNTER — Ambulatory Visit (INDEPENDENT_AMBULATORY_CARE_PROVIDER_SITE_OTHER): Payer: BLUE CROSS/BLUE SHIELD | Admitting: Family Medicine

## 2023-09-04 VITALS — BP 158/95 | HR 71 | Temp 98.3°F | Resp 18 | Ht 74.5 in | Wt 240.6 lb

## 2023-09-04 DIAGNOSIS — E1165 Type 2 diabetes mellitus with hyperglycemia: Secondary | ICD-10-CM

## 2023-09-04 DIAGNOSIS — Z7985 Long-term (current) use of injectable non-insulin antidiabetic drugs: Secondary | ICD-10-CM

## 2023-09-04 DIAGNOSIS — E785 Hyperlipidemia, unspecified: Secondary | ICD-10-CM

## 2023-09-04 DIAGNOSIS — Z23 Encounter for immunization: Secondary | ICD-10-CM | POA: Diagnosis not present

## 2023-09-04 DIAGNOSIS — I10 Essential (primary) hypertension: Secondary | ICD-10-CM | POA: Diagnosis not present

## 2023-09-04 DIAGNOSIS — Z7984 Long term (current) use of oral hypoglycemic drugs: Secondary | ICD-10-CM

## 2023-09-04 DIAGNOSIS — E1169 Type 2 diabetes mellitus with other specified complication: Secondary | ICD-10-CM

## 2023-09-04 DIAGNOSIS — E119 Type 2 diabetes mellitus without complications: Secondary | ICD-10-CM

## 2023-09-04 LAB — POCT GLYCOSYLATED HEMOGLOBIN (HGB A1C): Hemoglobin A1C: 7.9 % — AB (ref 4.0–5.6)

## 2023-09-05 LAB — MICROALBUMIN / CREATININE URINE RATIO
Creatinine, Urine: 118.6 mg/dL
Microalb/Creat Ratio: 26 mg/g{creat} (ref 0–29)
Microalbumin, Urine: 30.8 ug/mL

## 2023-09-10 ENCOUNTER — Encounter: Payer: Self-pay | Admitting: Family Medicine

## 2023-09-10 NOTE — Progress Notes (Signed)
Established Patient Office Visit  Subjective    Patient ID: Terry Horne, male    DOB: 1966-02-10  Age: 57 y.o. MRN: [id number]  CC:  Chief Complaint  Patient presents with   Follow-up    3 month    HPI Terry Horne presents for routine follow up of chronic med issues including diabetes and hypertension. Patient reports taking meds as recommended and denies acute complaints.   Outpatient Encounter Medications as of 09/04/2023  Medication Sig   atorvastatin (LIPITOR) 20 MG tablet Take 1 tablet by mouth once daily   atorvastatin (LIPITOR) 20 MG tablet Take 1 tablet (20 mg total) by mouth daily.   fenofibrate (TRICOR) 48 MG tablet Take 1 tablet by mouth once daily   Ferrous Sulfate (IRON PO) Take 1 tablet by mouth daily.   glimepiride (AMARYL) 4 MG tablet Take 1 tablet (4 mg total) by mouth daily with breakfast.   levothyroxine (SYNTHROID) 88 MCG tablet TAKE 1 TABLET BY MOUTH ONCE DAILY BEFORE BREAKFAST   losartan (COZAAR) 50 MG tablet Take 1 tablet by mouth once daily   metFORMIN (GLUCOPHAGE) 1000 MG tablet TAKE 1 TABLET BY MOUTH TWICE DAILY WITH A MEAL   Multiple Vitamins-Minerals (CENTRUM ADULTS) TABS Take by mouth.   pioglitazone (ACTOS) 45 MG tablet Take 1 tablet by mouth once daily   Semaglutide, 1 MG/DOSE, (OZEMPIC, 1 MG/DOSE,) 2 MG/1.5ML SOPN Inject into the skin.   tadalafil (CIALIS) 10 MG tablet TAKE 1 TABLET BY MOUTH AS NEEDED FOR ERECTILE DYSFUNCTION   tadalafil (CIALIS) 5 MG tablet Take 1 tablet (5 mg total) by mouth daily as needed.   Testosterone Undecanoate (JATENZO) 237 MG CAPS Take by mouth.   No facility-administered encounter medications on file as of 09/04/2023.    Past Medical History:  Diagnosis Date   Diabetes mellitus without complication (HCC)     History reviewed. No pertinent surgical history.  History reviewed. No pertinent family history.  Social History   Socioeconomic History   Marital status: Married    Spouse name: Not on file    Number of children: Not on file   Years of education: Not on file   Highest education level: Not on file  Occupational History   Not on file  Tobacco Use   Smoking status: Former   Smokeless tobacco: Never  Substance and Sexual Activity   Alcohol use: Yes    Alcohol/week: 0.0 standard drinks of alcohol   Drug use: No   Sexual activity: Yes  Other Topics Concern   Not on file  Social History Narrative   Not on file   Social Determinants of Health   Financial Resource Strain: Low Risk  (02/02/2023)   Overall Financial Resource Strain (CARDIA)    Difficulty of Paying Living Expenses: Not hard at all  Food Insecurity: No Food Insecurity (02/02/2023)   Hunger Vital Sign    Worried About Running Out of Food in the Last Year: Never true    Ran Out of Food in the Last Year: Never true  Transportation Needs: No Transportation Needs (02/02/2023)   PRAPARE - Administrator, Civil Service (Medical): No    Lack of Transportation (Non-Medical): No  Physical Activity: Sufficiently Active (02/02/2023)   Exercise Vital Sign    Days of Exercise per Week: 7 days    Minutes of Exercise per Session: 30 min  Stress: No Stress Concern Present (02/02/2023)   [name] - Occupational Stress Questionnaire  Feeling of Stress : Not at all  Social Connections: Unknown (02/02/2023)   Social Connection and Isolation Panel [NHANES]    Frequency of Communication with Friends and Family: More than three times a week    Frequency of Social Gatherings with Friends and Family: More than three times a week    Attends Religious Services: More than 4 times per year    Active Member of [employer] or Organizations: Yes    Attends Banker Meetings: 1 to 4 times per year    Marital Status: Not on file  Intimate Partner Violence: Not At Risk (02/02/2023)   Humiliation, Afraid, Rape, and Kick questionnaire    Fear of Current or Ex-Partner: No    Emotionally Abused: No     Physically Abused: No    Sexually Abused: No    Review of Systems  All other systems reviewed and are negative.       Objective    BP (!) 158/95 (BP Location: Right Arm, Patient Position: Sitting, Cuff Size: Large)   Pulse 71   Temp 98.3 F (36.8 C) (Oral)   Resp 18   Ht 6' 2.5" (1.892 m)   Wt 240 lb 9.6 oz (109.1 kg)   SpO2 97%   BMI 30.48 kg/m   Physical Exam Vitals and nursing note reviewed.  Constitutional:      General: He is not in acute distress. Cardiovascular:     Rate and Rhythm: Normal rate and regular rhythm.  Pulmonary:     Effort: Pulmonary effort is normal.     Breath sounds: Normal breath sounds.  Abdominal:     Palpations: Abdomen is soft.     Tenderness: There is no abdominal tenderness.  Musculoskeletal:     Right lower leg: No edema.     Left lower leg: No edema.  Neurological:     General: No focal deficit present.     Mental Status: He is alert and oriented to person, place, and time.         Assessment & Plan:  1. Type 2 diabetes mellitus with hyperglycemia, without long-term current use of insulin (HCC) Improved A1c but not near goal. continue - Urine Albumin/Creatinine with ratio (send out) [LAB689] - HgB A1c - HgB A1c  2. Essential hypertension Elevated readings. Discussed compliance. Continue   3. Hyperlipidemia associated with type 2 diabetes mellitus (HCC) Continue  4. Encounter for immunization  - Flu vaccine trivalent PF, 6mos and older(Flulaval,Afluria,Fluarix,Fluzone)  5. Diabetes mellitus treated with oral medication (HCC)   6. Long-term current use of injectable noninsulin antidiabetic medication   Return in about 3 months (around 12/05/2023) for follow up.   Tommie Raymond, MD

## 2023-09-14 ENCOUNTER — Other Ambulatory Visit: Payer: Self-pay | Admitting: Family Medicine

## 2023-09-25 ENCOUNTER — Telehealth: Payer: Self-pay | Admitting: Family Medicine

## 2023-09-25 NOTE — Telephone Encounter (Signed)
Patient needs refill for Tadalafil sent to [facility] on [address].

## 2023-10-08 ENCOUNTER — Other Ambulatory Visit: Payer: Self-pay | Admitting: Family Medicine

## 2023-10-21 ENCOUNTER — Other Ambulatory Visit: Payer: Self-pay | Admitting: Family Medicine

## 2023-11-02 ENCOUNTER — Other Ambulatory Visit: Payer: Self-pay | Admitting: Family Medicine

## 2023-11-02 ENCOUNTER — Other Ambulatory Visit: Payer: Self-pay

## 2023-11-02 DIAGNOSIS — E1169 Type 2 diabetes mellitus with other specified complication: Secondary | ICD-10-CM

## 2023-11-02 DIAGNOSIS — E1165 Type 2 diabetes mellitus with hyperglycemia: Secondary | ICD-10-CM

## 2023-11-02 MED ORDER — PIOGLITAZONE HCL 45 MG PO TABS: 45.0000 mg | ORAL_TABLET | Freq: Every day | ORAL | 0 refills | Status: DC

## 2023-11-02 MED ORDER — FENOFIBRATE 48 MG PO TABS: 48.0000 mg | ORAL_TABLET | Freq: Every day | ORAL | 0 refills | Status: DC

## 2023-11-22 ENCOUNTER — Other Ambulatory Visit: Payer: Self-pay | Admitting: *Deleted

## 2023-11-22 ENCOUNTER — Other Ambulatory Visit: Payer: Self-pay | Admitting: Family Medicine

## 2023-11-22 DIAGNOSIS — E1169 Type 2 diabetes mellitus with other specified complication: Secondary | ICD-10-CM

## 2023-12-04 ENCOUNTER — Other Ambulatory Visit: Payer: Self-pay | Admitting: Family Medicine

## 2023-12-05 ENCOUNTER — Ambulatory Visit (INDEPENDENT_AMBULATORY_CARE_PROVIDER_SITE_OTHER): Payer: Medicaid Other | Admitting: Family Medicine

## 2023-12-05 ENCOUNTER — Encounter: Payer: Self-pay | Admitting: Family Medicine

## 2023-12-05 VITALS — BP 143/89 | Temp 98.0°F | Resp 16 | Ht 74.0 in | Wt 231.0 lb

## 2023-12-05 DIAGNOSIS — E119 Type 2 diabetes mellitus without complications: Secondary | ICD-10-CM

## 2023-12-05 DIAGNOSIS — E1165 Type 2 diabetes mellitus with hyperglycemia: Secondary | ICD-10-CM | POA: Diagnosis not present

## 2023-12-05 DIAGNOSIS — I10 Essential (primary) hypertension: Secondary | ICD-10-CM | POA: Diagnosis not present

## 2023-12-05 DIAGNOSIS — Z7985 Long-term (current) use of injectable non-insulin antidiabetic drugs: Secondary | ICD-10-CM | POA: Diagnosis not present

## 2023-12-05 DIAGNOSIS — E785 Hyperlipidemia, unspecified: Secondary | ICD-10-CM

## 2023-12-05 DIAGNOSIS — N5202 Corporo-venous occlusive erectile dysfunction: Secondary | ICD-10-CM

## 2023-12-05 DIAGNOSIS — E1169 Type 2 diabetes mellitus with other specified complication: Secondary | ICD-10-CM

## 2023-12-05 DIAGNOSIS — Z7984 Long term (current) use of oral hypoglycemic drugs: Secondary | ICD-10-CM

## 2023-12-05 DIAGNOSIS — Z659 Problem related to unspecified psychosocial circumstances: Secondary | ICD-10-CM

## 2023-12-05 LAB — POCT GLYCOSYLATED HEMOGLOBIN (HGB A1C): Hemoglobin A1C: 7.9 % — AB (ref 4.0–5.6)

## 2023-12-05 MED ORDER — TADALAFIL 20 MG PO TABS: 10.0000 mg | ORAL_TABLET | ORAL | 11 refills | Status: DC | PRN

## 2023-12-05 NOTE — Progress Notes (Unsigned)
Established Patient Office Visit  Subjective    Patient ID: Terry Horne, male    DOB: April 20, 1966  Age: 58 y.o. MRN: [id number]  CC:  Chief Complaint  Patient presents with   Follow-up    3 month,     HPI Terry Horne presents for routine follow up of chronic med issues including diabetes and hypertension. Patient reports med compliance.  He reports increased social stressors.    Outpatient Encounter Medications as of 12/05/2023  Medication Sig   atorvastatin (LIPITOR) 20 MG tablet Take 1 tablet by mouth once daily   atorvastatin (LIPITOR) 20 MG tablet Take 1 tablet by mouth once daily   fenofibrate (TRICOR) 48 MG tablet Take 1 tablet by mouth once daily   fenofibrate (TRICOR) 48 MG tablet Take 1 tablet (48 mg total) by mouth daily.   Ferrous Sulfate (IRON PO) Take 1 tablet by mouth daily.   glimepiride (AMARYL) 4 MG tablet Take 1 tablet (4 mg total) by mouth daily with breakfast.   levothyroxine (SYNTHROID) 88 MCG tablet TAKE 1 TABLET BY MOUTH ONCE DAILY BEFORE BREAKFAST   losartan (COZAAR) 50 MG tablet Take 1 tablet by mouth once daily   metFORMIN (GLUCOPHAGE) 1000 MG tablet TAKE 1 TABLET BY MOUTH TWICE DAILY WITH A MEAL   Multiple Vitamins-Minerals (CENTRUM ADULTS) TABS Take by mouth.   pioglitazone (ACTOS) 45 MG tablet Take 1 tablet by mouth once daily   pioglitazone (ACTOS) 45 MG tablet Take 1 tablet (45 mg total) by mouth daily.   Semaglutide, 1 MG/DOSE, (OZEMPIC, 1 MG/DOSE,) 2 MG/1.5ML SOPN Inject into the skin.   tadalafil (CIALIS) 10 MG tablet TAKE 1 TABLET BY MOUTH AS NEEDED FOR ERECTILE DYSFUNCTION   tadalafil (CIALIS) 5 MG tablet Take 1 tablet (5 mg total) by mouth daily as needed.   Testosterone Undecanoate (JATENZO) 237 MG CAPS Take by mouth.   No facility-administered encounter medications on file as of 12/05/2023.    Past Medical History:  Diagnosis Date   Diabetes mellitus without complication (HCC)     History reviewed. No pertinent surgical  history.  History reviewed. No pertinent family history.  Social History   Socioeconomic History   Marital status: Married    Spouse name: Not on file   Number of children: Not on file   Years of education: Not on file   Highest education level: Not on file  Occupational History   Not on file  Tobacco Use   Smoking status: Former   Smokeless tobacco: Never  Substance and Sexual Activity   Alcohol use: Yes    Alcohol/week: 0.0 standard drinks of alcohol   Drug use: No   Sexual activity: Yes  Other Topics Concern   Not on file  Social History Narrative   Not on file   Social Drivers of Health   Financial Resource Strain: Low Risk  (02/02/2023)   Overall Financial Resource Strain (CARDIA)    Difficulty of Paying Living Expenses: Not hard at all  Food Insecurity: No Food Insecurity (02/02/2023)   Hunger Vital Sign    Worried About Running Out of Food in the Last Year: Never true    Ran Out of Food in the Last Year: Never true  Transportation Needs: No Transportation Needs (02/02/2023)   PRAPARE - Administrator, Civil Service (Medical): No    Lack of Transportation (Non-Medical): No  Physical Activity: Sufficiently Active (02/02/2023)   Exercise Vital Sign    Days of Exercise per  Week: 7 days    Minutes of Exercise per Session: 30 min  Stress: No Stress Concern Present (02/02/2023)   [name] - Occupational Stress Questionnaire    Feeling of Stress : Not at all  Social Connections: Unknown (02/02/2023)   Social Connection and Isolation Panel [NHANES]    Frequency of Communication with Friends and Family: More than three times a week    Frequency of Social Gatherings with Friends and Family: More than three times a week    Attends Religious Services: More than 4 times per year    Active Member of [employer] or Organizations: Yes    Attends Banker Meetings: 1 to 4 times per year    Marital Status: Not on file  Intimate  Partner Violence: Not At Risk (02/02/2023)   Humiliation, Afraid, Rape, and Kick questionnaire    Fear of Current or Ex-Partner: No    Emotionally Abused: No    Physically Abused: No    Sexually Abused: No    Review of Systems  All other systems reviewed and are negative.       Objective    BP (!) 143/89 (BP Location: Right Arm, Patient Position: Sitting, Cuff Size: Large)   Temp 98 F (36.7 C) (Oral)   Resp 16   Ht 6\' 2"  (1.88 m)   Wt 231 lb (104.8 kg)   SpO2 93%   BMI 29.66 kg/m   Physical Exam Vitals and nursing note reviewed.  Constitutional:      General: He is not in acute distress. Cardiovascular:     Rate and Rhythm: Normal rate and regular rhythm.  Pulmonary:     Effort: Pulmonary effort is normal.     Breath sounds: Normal breath sounds.  Abdominal:     Palpations: Abdomen is soft.     Tenderness: There is no abdominal tenderness.  Musculoskeletal:     Right lower leg: No edema.     Left lower leg: No edema.  Neurological:     General: No focal deficit present.     Mental Status: He is alert and oriented to person, place, and time.         Assessment & Plan:   1. Type 2 diabetes mellitus with hyperglycemia, without long-term current use of insulin (HCC) (Primary) A1c stable and above goal. Work to decrease.  - HgB A1c  2. Essential hypertension Slightly elevated readings. continue  3. Diabetes mellitus treated with oral medication (HCC)   4. Long-term current use of injectable noninsulin antidiabetic medication   5. Hyperlipidemia associated with type 2 diabetes mellitus (HCC)   6. Other social stressor    7. Corporo-venous occlusive erectile dysfunction Cialis prescribed.   Return in about 3 months (around 03/03/2024) for follow up, physical.   Tommie Raymond, MD

## 2023-12-06 ENCOUNTER — Encounter: Payer: Self-pay | Admitting: Family Medicine

## 2024-01-04 ENCOUNTER — Other Ambulatory Visit: Payer: Self-pay | Admitting: Family Medicine

## 2024-02-13 ENCOUNTER — Other Ambulatory Visit: Payer: Self-pay | Admitting: Family Medicine

## 2024-02-14 ENCOUNTER — Encounter: Payer: Self-pay | Admitting: Family Medicine

## 2024-03-03 ENCOUNTER — Encounter: Payer: BLUE CROSS/BLUE SHIELD | Admitting: Family Medicine

## 2024-03-19 ENCOUNTER — Encounter: Payer: Self-pay | Admitting: Family Medicine

## 2024-03-19 ENCOUNTER — Ambulatory Visit (INDEPENDENT_AMBULATORY_CARE_PROVIDER_SITE_OTHER): Admitting: Family Medicine

## 2024-03-19 VITALS — BP 163/90 | HR 73 | Wt 240.8 lb

## 2024-03-19 DIAGNOSIS — E039 Hypothyroidism, unspecified: Secondary | ICD-10-CM

## 2024-03-19 DIAGNOSIS — Z0000 Encounter for general adult medical examination without abnormal findings: Secondary | ICD-10-CM | POA: Diagnosis not present

## 2024-03-19 DIAGNOSIS — Z1159 Encounter for screening for other viral diseases: Secondary | ICD-10-CM | POA: Diagnosis not present

## 2024-03-19 DIAGNOSIS — Z7984 Long term (current) use of oral hypoglycemic drugs: Secondary | ICD-10-CM | POA: Diagnosis not present

## 2024-03-19 DIAGNOSIS — Z13220 Encounter for screening for lipoid disorders: Secondary | ICD-10-CM | POA: Diagnosis not present

## 2024-03-19 DIAGNOSIS — Z130 Encounter for screening for diseases of the blood and blood-forming organs and certain disorders involving the immune mechanism: Secondary | ICD-10-CM | POA: Diagnosis not present

## 2024-03-19 DIAGNOSIS — E1165 Type 2 diabetes mellitus with hyperglycemia: Secondary | ICD-10-CM

## 2024-03-19 DIAGNOSIS — E119 Type 2 diabetes mellitus without complications: Secondary | ICD-10-CM | POA: Diagnosis not present

## 2024-03-19 LAB — POCT GLYCOSYLATED HEMOGLOBIN (HGB A1C): HbA1c, POC (controlled diabetic range): 7.5 % — AB (ref 0.0–7.0)

## 2024-03-19 MED ORDER — LOSARTAN POTASSIUM 100 MG PO TABS: 100.0000 mg | ORAL_TABLET | Freq: Every day | ORAL | 1 refills | Status: DC

## 2024-03-19 NOTE — Progress Notes (Signed)
 Established Patient Office Visit  Subjective    Patient ID: Terry Horne, male    DOB: [birth year]  Age: 58 y.o. MRN: [id number]  CC:  Chief Complaint  Patient presents with   Annual Exam    HPI Terry Horne presents for routine annual exam. Patient denies acute complaints.   Outpatient Encounter Medications as of 03/19/2024  Medication Sig   atorvastatin  (LIPITOR) 20 MG tablet Take 1 tablet by mouth once daily   fenofibrate  (TRICOR ) 48 MG tablet Take 1 tablet by mouth once daily   Ferrous Sulfate (IRON PO) Take 1 tablet by mouth daily.   glimepiride  (AMARYL ) 4 MG tablet Take 1 tablet (4 mg total) by mouth daily with breakfast.   levothyroxine  (SYNTHROID ) 88 MCG tablet TAKE 1 TABLET BY MOUTH ONCE DAILY BEFORE BREAKFAST   losartan  (COZAAR ) 50 MG tablet Take 1 tablet by mouth once daily   metFORMIN  (GLUCOPHAGE ) 1000 MG tablet TAKE 1 TABLET BY MOUTH TWICE DAILY WITH A MEAL   Multiple Vitamins-Minerals (CENTRUM ADULTS) TABS Take by mouth.   pioglitazone  (ACTOS ) 45 MG tablet Take 1 tablet by mouth once daily   Semaglutide, 1 MG/DOSE, (OZEMPIC, 1 MG/DOSE,) 2 MG/1.5ML SOPN Inject into the skin.   atorvastatin  (LIPITOR) 20 MG tablet Take 1 tablet by mouth once daily   fenofibrate  (TRICOR ) 48 MG tablet Take 1 tablet (48 mg total) by mouth daily.   pioglitazone  (ACTOS ) 45 MG tablet Take 1 tablet (45 mg total) by mouth daily.   tadalafil  (CIALIS ) 10 MG tablet TAKE 1 TABLET BY MOUTH AS NEEDED FOR ERECTILE DYSFUNCTION   tadalafil  (CIALIS ) 20 MG tablet Take 0.5-1 tablets (10-20 mg total) by mouth every other day as needed for erectile dysfunction.   Testosterone Undecanoate (JATENZO) 237 MG CAPS Take by mouth.   No facility-administered encounter medications on file as of 03/19/2024.    Past Medical History:  Diagnosis Date   Diabetes mellitus without complication (HCC)     History reviewed. No pertinent surgical history.  History reviewed. No pertinent family history.  Social  History   Socioeconomic History   Marital status: Married    Spouse name: Not on file   Number of children: Not on file   Years of education: Not on file   Highest education level: Not on file  Occupational History   Not on file  Tobacco Use   Smoking status: Former   Smokeless tobacco: Never  Substance and Sexual Activity   Alcohol use: Yes    Alcohol/week: 0.0 standard drinks of alcohol   Drug use: No   Sexual activity: Yes  Other Topics Concern   Not on file  Social History Narrative   Not on file   Social Drivers of Health   Financial Resource Strain: Low Risk  (02/02/2023)   Overall Financial Resource Strain (CARDIA)    Difficulty of Paying Living Expenses: Not hard at all  Food Insecurity: No Food Insecurity (02/02/2023)   Hunger Vital Sign    Worried About Running Out of Food in the Last Year: Never true    Ran Out of Food in the Last Year: Never true  Transportation Needs: No Transportation Needs (02/02/2023)   PRAPARE - Administrator, Civil Service (Medical): No    Lack of Transportation (Non-Medical): No  Physical Activity: Sufficiently Active (02/02/2023)   Exercise Vital Sign    Days of Exercise per Week: 7 days    Minutes of Exercise per Session: 30 min  Stress:  No Stress Concern Present (02/02/2023)   [name] - Occupational Stress Questionnaire    Feeling of Stress : Not at all  Social Connections: Unknown (02/02/2023)   Social Connection and Isolation Panel [NHANES]    Frequency of Communication with Friends and Family: More than three times a week    Frequency of Social Gatherings with Friends and Family: More than three times a week    Attends Religious Services: More than 4 times per year    Active Member of [employer] or Organizations: Yes    Attends Banker Meetings: 1 to 4 times per year    Marital Status: Not on file  Intimate Partner Violence: Not At Risk (02/02/2023)   Humiliation, Afraid, Rape,  and Kick questionnaire    Fear of Current or Ex-Partner: No    Emotionally Abused: No    Physically Abused: No    Sexually Abused: No    Review of Systems  All other systems reviewed and are negative.       Objective    BP (!) 163/90 (BP Location: Right Arm, Patient Position: Sitting, Cuff Size: Normal)   Pulse 73   Wt 240 lb 12.8 oz (109.2 kg)   SpO2 96%   BMI 30.92 kg/m   Physical Exam Vitals and nursing note reviewed.  Constitutional:      General: He is not in acute distress. HENT:     Head: Normocephalic and atraumatic.     Right Ear: Tympanic membrane, ear canal and external ear normal.     Left Ear: Tympanic membrane, ear canal and external ear normal.     Nose: Nose normal.     Mouth/Throat:     Mouth: Mucous membranes are moist.     Pharynx: Oropharynx is clear.  Eyes:     Conjunctiva/sclera: Conjunctivae normal.     Pupils: Pupils are equal, round, and reactive to light.  Neck:     Thyroid: No thyromegaly.  Cardiovascular:     Rate and Rhythm: Normal rate and regular rhythm.     Heart sounds: Normal heart sounds. No murmur heard. Pulmonary:     Effort: Pulmonary effort is normal.     Breath sounds: Normal breath sounds.  Abdominal:     General: There is no distension.     Palpations: Abdomen is soft. There is no mass.     Tenderness: There is no abdominal tenderness.     Hernia: There is no hernia in the left inguinal area or right inguinal area.  Musculoskeletal:        General: Normal range of motion.     Cervical back: Normal range of motion and neck supple.     Right lower leg: No edema.     Left lower leg: No edema.  Skin:    General: Skin is warm and dry.  Neurological:     General: No focal deficit present.     Mental Status: He is alert and oriented to person, place, and time. Mental status is at baseline.  Psychiatric:        Mood and Affect: Mood normal.        Behavior: Behavior normal.         Assessment & Plan:   Annual  physical exam -     CMP14+EGFR  Acquired hypothyroidism -     TSH + free T4  Type 2 diabetes mellitus with hyperglycemia, without long-term current use of insulin (HCC) -  POCT glycosylated hemoglobin (Hb A1C)  Screening for deficiency anemia -     CBC with Differential/Platelet  Screening for lipid disorders -     Lipid panel  Need for hepatitis C screening test -     Hepatitis C antibody     Return in about 3 months (around 06/19/2024) for follow up, chronic med issues.   Arlo Lama, MD

## 2024-03-20 LAB — CBC WITH DIFFERENTIAL/PLATELET
Basophils Absolute: 0 10*3/uL (ref 0.0–0.2)
Basos: 1 %
EOS (ABSOLUTE): 0.1 10*3/uL (ref 0.0–0.4)
Eos: 3 %
Hematocrit: 42.2 % (ref 37.5–51.0)
Hemoglobin: 13.8 g/dL (ref 13.0–17.7)
Immature Grans (Abs): 0 10*3/uL (ref 0.0–0.1)
Immature Granulocytes: 1 %
Lymphocytes Absolute: 1.4 10*3/uL (ref 0.7–3.1)
Lymphs: 33 %
MCH: 30.8 pg (ref 26.6–33.0)
MCHC: 32.7 g/dL (ref 31.5–35.7)
MCV: 94 fL (ref 79–97)
Monocytes Absolute: 0.4 10*3/uL (ref 0.1–0.9)
Monocytes: 10 %
Neutrophils Absolute: 2.3 10*3/uL (ref 1.4–7.0)
Neutrophils: 52 %
Platelets: 218 10*3/uL (ref 150–450)
RBC: 4.48 x10E6/uL (ref 4.14–5.80)
RDW: 13 % (ref 11.6–15.4)
WBC: 4.4 10*3/uL (ref 3.4–10.8)

## 2024-03-20 LAB — LIPID PANEL
Chol/HDL Ratio: 2.8 ratio (ref 0.0–5.0)
Cholesterol, Total: 161 mg/dL (ref 100–199)
HDL: 57 mg/dL (ref >39)
LDL Chol Calc (NIH): 83 mg/dL (ref 0–99)
Triglycerides: 118 mg/dL (ref 0–149)
VLDL Cholesterol Cal: 21 mg/dL (ref 5–40)

## 2024-03-20 LAB — CMP14+EGFR
ALT: 27 IU/L (ref 0–44)
AST: 26 IU/L (ref 0–40)
Albumin: 4.7 g/dL (ref 3.8–4.9)
Alkaline Phosphatase: 81 IU/L (ref 44–121)
BUN/Creatinine Ratio: 13 (ref 9–20)
BUN: 15 mg/dL (ref 6–24)
Bilirubin Total: 0.6 mg/dL (ref 0.0–1.2)
CO2: 21 mmol/L (ref 20–29)
Calcium: 10 mg/dL (ref 8.7–10.2)
Chloride: 103 mmol/L (ref 96–106)
Creatinine, Ser: 1.17 mg/dL (ref 0.76–1.27)
Globulin, Total: 2.8 g/dL (ref 1.5–4.5)
Glucose: 112 mg/dL — ABNORMAL HIGH (ref 70–99)
Potassium: 4.3 mmol/L (ref 3.5–5.2)
Sodium: 141 mmol/L (ref 134–144)
Total Protein: 7.5 g/dL (ref 6.0–8.5)
eGFR: 72 mL/min/{1.73_m2} (ref >59)

## 2024-03-20 LAB — TSH+FREE T4
Free T4: 1.43 ng/dL (ref 0.82–1.77)
TSH: 1.57 u[IU]/mL (ref 0.450–4.500)

## 2024-03-20 LAB — HEPATITIS C ANTIBODY: Hep C Virus Ab: NONREACTIVE

## 2024-04-03 ENCOUNTER — Ambulatory Visit: Payer: Self-pay | Admitting: Family Medicine

## 2024-04-05 ENCOUNTER — Other Ambulatory Visit: Payer: Self-pay | Admitting: Family Medicine

## 2024-04-08 NOTE — Telephone Encounter (Signed)
 Requested Prescriptions  Pending Prescriptions Disp Refills   metFORMIN  (GLUCOPHAGE ) 1000 MG tablet [Pharmacy Med Name: metFORMIN  HCl 1000 MG Oral Tablet] 180 tablet 0    Sig: TAKE 1 TABLET BY MOUTH TWICE DAILY WITH A MEAL     Endocrinology:  Diabetes - Biguanides Failed - 04/08/2024 11:03 AM      Failed - B12 Level in normal range and within 720 days    No results found for: VITAMINB12       Passed - Cr in normal range and within 360 days    Creatinine, Ser  Date Value Ref Range Status  03/19/2024 1.17 0.76 - 1.27 mg/dL Final         Passed - HBA1C is between 0 and 7.9 and within 180 days    HbA1c, POC (controlled diabetic range)  Date Value Ref Range Status  03/19/2024 7.5 (A) 0.0 - 7.0 % Final         Passed - eGFR in normal range and within 360 days    eGFR  Date Value Ref Range Status  03/19/2024 72 >59 mL/min/1.73 Final         Passed - Valid encounter within last 6 months    Recent Outpatient Visits           2 weeks ago Annual physical exam   [facility], MD   4 months ago Type 2 diabetes mellitus with hyperglycemia, without long-term current use of insulin (HCC)   [facility], MD   7 months ago Type 2 diabetes mellitus with hyperglycemia, without long-term current use of insulin (HCC)   [facility] [facility], Ray Caffey, MD   10 months ago Essential hypertension   [place] [facility], Ray Caffey, MD   1 year ago Type 2 diabetes mellitus with hyperglycemia, without long-term current use of insulin [facility])   [facility] [facility], Ray Caffey, MD              Passed - CBC within normal limits and completed in the last 12 months    WBC  Date Value Ref Range Status  03/19/2024 4.4 3.4 - 10.8 x10E3/uL Final  09/14/2008 7.7 4.0 - 10.5 10*3/microliter Final   RBC  Date Value Ref Range Status   03/19/2024 4.48 4.14 - 5.80 x10E6/uL Final  09/14/2008 4.80 4.22 - 5.81 M/uL Final   Hemoglobin  Date Value Ref Range Status  03/19/2024 13.8 13.0 - 17.7 g/dL Final   Hematocrit  Date Value Ref Range Status  03/19/2024 42.2 37.5 - 51.0 % Final   MCHC  Date Value Ref Range Status  03/19/2024 32.7 31.5 - 35.7 g/dL Final  16/07/9603 54.0 30.0 - 36.0 g/dL Final   [facility]  Date Value Ref Range Status  03/19/2024 30.8 26.6 - 33.0 pg Final   MCV  Date Value Ref Range Status  03/19/2024 94 79 - 97 fL Final   No results found for: PLTCOUNTKUC, LABPLAT, POCPLA RDW  Date Value Ref Range Status  03/19/2024 13.0 11.6 - 15.4 % Final

## 2024-05-09 ENCOUNTER — Other Ambulatory Visit: Payer: Self-pay | Admitting: Family Medicine

## 2024-05-09 DIAGNOSIS — E1169 Type 2 diabetes mellitus with other specified complication: Secondary | ICD-10-CM

## 2024-05-17 ENCOUNTER — Other Ambulatory Visit: Payer: Self-pay | Admitting: Family Medicine

## 2024-05-17 DIAGNOSIS — E1169 Type 2 diabetes mellitus with other specified complication: Secondary | ICD-10-CM

## 2024-05-19 NOTE — Telephone Encounter (Signed)
 Requested medications are due for refill today.  yes  Requested medications are on the active medications list.  yes  Last refill. 11/22/2023 #90 0 rf  Future visit scheduled.   yes  Notes to clinic.  Please review pt is prescribed atorvastatin  and fenofibrate .    Requested Prescriptions  Pending Prescriptions Disp Refills   atorvastatin  (LIPITOR) 20 MG tablet [Pharmacy Med Name: Atorvastatin  Calcium  20 MG Oral Tablet] 90 tablet 0    Sig: Take 1 tablet by mouth once daily     Cardiovascular:  Antilipid - Statins Failed - 05/19/2024  2:21 PM      Failed - Lipid Panel in normal range within the last 12 months    Cholesterol, Total  Date Value Ref Range Status  03/19/2024 161 100 - 199 mg/dL Final   LDL Chol Calc (NIH)  Date Value Ref Range Status  03/19/2024 83 0 - 99 mg/dL Final   HDL  Date Value Ref Range Status  03/19/2024 57 >39 mg/dL Final   Triglycerides  Date Value Ref Range Status  03/19/2024 118 0 - 149 mg/dL Final         Passed - Patient is not pregnant      Passed - Valid encounter within last 12 months    Recent Outpatient Visits           2 months ago Annual physical exam   [facility], [place], MD   5 months ago Type 2 diabetes mellitus with hyperglycemia, without long-term current use of insulin (HCC)   [place] [facility], Raguel, MD   8 months ago Type 2 diabetes mellitus with hyperglycemia, without long-term current use of insulin [facility])   [facility] [facility], MD   11 months ago Essential hypertension   [facility] [facility] [place], MD   1 year ago Type 2 diabetes mellitus with hyperglycemia, without long-term current use of insulin [facility])   [facility], MD

## 2024-06-07 ENCOUNTER — Other Ambulatory Visit: Payer: Self-pay | Admitting: Family Medicine

## 2024-06-07 DIAGNOSIS — E1165 Type 2 diabetes mellitus with hyperglycemia: Secondary | ICD-10-CM

## 2024-06-19 ENCOUNTER — Ambulatory Visit: Admitting: Family Medicine

## 2024-06-19 ENCOUNTER — Encounter: Payer: Self-pay | Admitting: Family Medicine

## 2024-06-19 VITALS — BP 146/87 | HR 66 | Ht 74.0 in | Wt 234.4 lb

## 2024-06-19 DIAGNOSIS — E1169 Type 2 diabetes mellitus with other specified complication: Secondary | ICD-10-CM

## 2024-06-19 DIAGNOSIS — Z87891 Personal history of nicotine dependence: Secondary | ICD-10-CM

## 2024-06-19 DIAGNOSIS — I10 Essential (primary) hypertension: Secondary | ICD-10-CM

## 2024-06-19 DIAGNOSIS — Z7985 Long-term (current) use of injectable non-insulin antidiabetic drugs: Secondary | ICD-10-CM

## 2024-06-19 DIAGNOSIS — E1165 Type 2 diabetes mellitus with hyperglycemia: Secondary | ICD-10-CM

## 2024-06-19 DIAGNOSIS — Z7984 Long term (current) use of oral hypoglycemic drugs: Secondary | ICD-10-CM

## 2024-06-19 DIAGNOSIS — E785 Hyperlipidemia, unspecified: Secondary | ICD-10-CM

## 2024-06-19 DIAGNOSIS — Z79899 Other long term (current) drug therapy: Secondary | ICD-10-CM

## 2024-06-19 DIAGNOSIS — E039 Hypothyroidism, unspecified: Secondary | ICD-10-CM

## 2024-06-19 MED ORDER — AMLODIPINE BESYLATE 5 MG PO TABS: 5.0000 mg | ORAL_TABLET | Freq: Every day | ORAL | 0 refills | Status: DC

## 2024-06-19 NOTE — Progress Notes (Signed)
 Established Patient Office Visit  Subjective    Patient ID: Terry Horne, male    DOB: 28-Nov-1965  Age: 58 y.o. MRN: [id number]  CC:  Chief Complaint  Patient presents with   Medical Management of Chronic Issues    HPI Terry Horne presents for routine follow up of chronic med issues including diabetes and hypertension. Patient reports med compliance and denies acute complaints.   Outpatient Encounter Medications as of 06/19/2024  Medication Sig   amLODipine  (NORVASC ) 5 MG tablet Take 1 tablet (5 mg total) by mouth daily.   atorvastatin  (LIPITOR) 20 MG tablet Take 1 tablet by mouth once daily   atorvastatin  (LIPITOR) 20 MG tablet Take 1 tablet by mouth once daily   fenofibrate  (TRICOR ) 48 MG tablet Take 1 tablet by mouth once daily   fenofibrate  (TRICOR ) 48 MG tablet Take 1 tablet by mouth once daily   Ferrous Sulfate (IRON PO) Take 1 tablet by mouth daily.   glimepiride  (AMARYL ) 4 MG tablet Take 1 tablet by mouth once daily with breakfast   levothyroxine  (SYNTHROID ) 88 MCG tablet TAKE 1 TABLET BY MOUTH ONCE DAILY BEFORE BREAKFAST   losartan  (COZAAR ) 100 MG tablet Take 1 tablet (100 mg total) by mouth daily.   metFORMIN  (GLUCOPHAGE ) 1000 MG tablet TAKE 1 TABLET BY MOUTH TWICE DAILY WITH A MEAL   Multiple Vitamins-Minerals (CENTRUM ADULTS) TABS Take by mouth.   pioglitazone  (ACTOS ) 45 MG tablet Take 1 tablet by mouth once daily   pioglitazone  (ACTOS ) 45 MG tablet Take 1 tablet (45 mg total) by mouth daily.   Semaglutide, 1 MG/DOSE, (OZEMPIC, 1 MG/DOSE,) 2 MG/1.5ML SOPN Inject into the skin.   tadalafil  (CIALIS ) 10 MG tablet TAKE 1 TABLET BY MOUTH AS NEEDED FOR ERECTILE DYSFUNCTION   tadalafil  (CIALIS ) 20 MG tablet Take 0.5-1 tablets (10-20 mg total) by mouth every other day as needed for erectile dysfunction.   Testosterone Undecanoate (JATENZO) 237 MG CAPS Take by mouth.   [DISCONTINUED] losartan  (COZAAR ) 50 MG tablet Take 1 tablet by mouth once daily   No  facility-administered encounter medications on file as of 06/19/2024.    Past Medical History:  Diagnosis Date   Diabetes mellitus without complication (HCC)     History reviewed. No pertinent surgical history.  History reviewed. No pertinent family history.  Social History   Socioeconomic History   Marital status: Married    Spouse name: Not on file   Number of children: Not on file   Years of education: Not on file   Highest education level: Not on file  Occupational History   Not on file  Tobacco Use   Smoking status: Former   Smokeless tobacco: Never  Substance and Sexual Activity   Alcohol use: Yes    Alcohol/week: 0.0 standard drinks of alcohol   Drug use: No   Sexual activity: Yes  Other Topics Concern   Not on file  Social History Narrative   Not on file   Social Drivers of Health   Financial Resource Strain: Low Risk  (02/02/2023)   Overall Financial Resource Strain (CARDIA)    Difficulty of Paying Living Expenses: Not hard at all  Food Insecurity: No Food Insecurity (02/02/2023)   Hunger Vital Sign    Worried About Running Out of Food in the Last Year: Never true    Ran Out of Food in the Last Year: Never true  Transportation Needs: No Transportation Needs (02/02/2023)   PRAPARE - Transportation    Lack of  Transportation (Medical): No    Lack of Transportation (Non-Medical): No  Physical Activity: Sufficiently Active (02/02/2023)   Exercise Vital Sign    Days of Exercise per Week: 7 days    Minutes of Exercise per Session: 30 min  Stress: No Stress Concern Present (02/02/2023)   [name] - Occupational Stress Questionnaire    Feeling of Stress : Not at all  Social Connections: Unknown (02/02/2023)   Social Connection and Isolation Panel    Frequency of Communication with Friends and Family: More than three times a week    Frequency of Social Gatherings with Friends and Family: More than three times a week    Attends  Religious Services: More than 4 times per year    Active Member of [employer] or Organizations: Yes    Attends Banker Meetings: 1 to 4 times per year    Marital Status: Not on file  Intimate Partner Violence: Not At Risk (02/02/2023)   Humiliation, Afraid, Rape, and Kick questionnaire    Fear of Current or Ex-Partner: No    Emotionally Abused: No    Physically Abused: No    Sexually Abused: No    Review of Systems  All other systems reviewed and are negative.       Objective    BP (!) 146/87   Pulse 66   Ht 6' 2 (1.88 m)   Wt 234 lb 6.4 oz (106.3 kg)   SpO2 95%   BMI 30.10 kg/m   Physical Exam Vitals and nursing note reviewed.  Constitutional:      General: He is not in acute distress. Cardiovascular:     Rate and Rhythm: Normal rate and regular rhythm.  Pulmonary:     Effort: Pulmonary effort is normal.     Breath sounds: Normal breath sounds.  Abdominal:     Palpations: Abdomen is soft.     Tenderness: There is no abdominal tenderness.  Neurological:     General: No focal deficit present.     Mental Status: He is alert and oriented to person, place, and time.         Assessment & Plan:   1. Type 2 diabetes mellitus with hyperglycemia, without long-term current use of insulin (HCC) (Primary) Appears stable and just above goal. Continue   2. Hyperlipidemia associated with type 2 diabetes mellitus (HCC) Continue   3. Acquired hypothyroidism Appears stable. Continue   4. Essential hypertension Slightly elevated readings, will add amlodipine  5 mg daily to regimen    Return in about 3 months (around 09/19/2024) for follow up.   Tanda Raguel SQUIBB, MD

## 2024-07-15 ENCOUNTER — Other Ambulatory Visit: Payer: Self-pay | Admitting: Family Medicine

## 2024-07-15 DIAGNOSIS — E1165 Type 2 diabetes mellitus with hyperglycemia: Secondary | ICD-10-CM

## 2024-08-05 ENCOUNTER — Other Ambulatory Visit: Payer: Self-pay | Admitting: Family Medicine

## 2024-08-05 DIAGNOSIS — E1169 Type 2 diabetes mellitus with other specified complication: Secondary | ICD-10-CM

## 2024-08-16 ENCOUNTER — Other Ambulatory Visit: Payer: Self-pay | Admitting: Family Medicine

## 2024-08-16 DIAGNOSIS — E1169 Type 2 diabetes mellitus with other specified complication: Secondary | ICD-10-CM

## 2024-08-18 NOTE — Telephone Encounter (Signed)
 Requested Prescriptions  Pending Prescriptions Disp Refills   atorvastatin  (LIPITOR) 20 MG tablet [Pharmacy Med Name: Atorvastatin  Calcium  20 MG Oral Tablet] 90 tablet 1    Sig: Take 1 tablet by mouth once daily     Cardiovascular:  Antilipid - Statins Failed - 08/18/2024  5:55 PM      Failed - Lipid Panel in normal range within the last 12 months    Cholesterol, Total  Date Value Ref Range Status  03/19/2024 161 100 - 199 mg/dL Final   LDL Chol Calc (NIH)  Date Value Ref Range Status  03/19/2024 83 0 - 99 mg/dL Final   HDL  Date Value Ref Range Status  03/19/2024 57 >39 mg/dL Final   Triglycerides  Date Value Ref Range Status  03/19/2024 118 0 - 149 mg/dL Final         Passed - Patient is not pregnant      Passed - Valid encounter within last 12 months    Recent Outpatient Visits           2 months ago Type 2 diabetes mellitus with hyperglycemia, without long-term current use of insulin (HCC)   [facility], MD   5 months ago Annual physical exam   [facility], [place], MD   8 months ago Type 2 diabetes mellitus with hyperglycemia, without long-term current use of insulin [facility])   [facility], Rag[place], MD   11 months ago Type 2 diabetes mellitus with hyperglycemia, without long-term current use of insulin [facility])   [facility] [facility], MD   1 year ago Essential hypertension   [facility] [facility], MD

## 2024-08-27 ENCOUNTER — Other Ambulatory Visit: Payer: Self-pay | Admitting: Family Medicine

## 2024-09-08 ENCOUNTER — Other Ambulatory Visit: Payer: Self-pay | Admitting: Family Medicine

## 2024-09-08 DIAGNOSIS — E1165 Type 2 diabetes mellitus with hyperglycemia: Secondary | ICD-10-CM

## 2024-09-13 ENCOUNTER — Other Ambulatory Visit: Payer: Self-pay | Admitting: Family Medicine

## 2024-09-14 ENCOUNTER — Other Ambulatory Visit: Payer: Self-pay | Admitting: Family Medicine

## 2024-09-15 ENCOUNTER — Telehealth: Payer: Self-pay | Admitting: Family Medicine

## 2024-09-15 NOTE — Telephone Encounter (Signed)
 Pt came to practice to ask for medication refill for tadalafil . Pt stated he has no more left and has an appointment on Monday 12/01

## 2024-09-15 NOTE — Telephone Encounter (Signed)
 Requested Prescriptions  Pending Prescriptions Disp Refills   tadalafil  (CIALIS ) 20 MG tablet [Pharmacy Med Name: Tadalafil  20 MG Oral Tablet] 10 tablet 0    Sig: TAKE 1/2 TO 1 (ONE-HALF TO ONE) TABLET BY MOUTH EVERY OTHER DAY AS NEEDED FOR ERECTILE DYSFUNCTION     Urology: Erectile Dysfunction Agents Failed - 09/15/2024  4:24 PM      Failed - Last BP in normal range    BP Readings from Last 1 Encounters:  06/19/24 (!) 146/87         Passed - AST in normal range and within 360 days    AST  Date Value Ref Range Status  03/19/2024 26 0 - 40 IU/L Final         Passed - ALT in normal range and within 360 days    ALT  Date Value Ref Range Status  03/19/2024 27 0 - 44 IU/L Final         Passed - Valid encounter within last 12 months    Recent Outpatient Visits           2 months ago Type 2 diabetes mellitus with hyperglycemia, without long-term current use of insulin (HCC)   [place] [facility], [place]   6 months ago Annual physical exam   [place] [facility], [place], MD   9 months ago Type 2 diabetes mellitus with hyperglycemia, without long-term current use of insulin [facility])   [place] [facility], [place], MD   1 year ago Type 2 diabetes mellitus with hyperglycemia, without long-term current use of insulin [facility])   [place] [facility], MD   1 year ago Essential hypertension   [place] [facility], MD

## 2024-09-15 NOTE — Telephone Encounter (Signed)
 Requested Prescriptions  Pending Prescriptions Disp Refills   losartan  (COZAAR ) 100 MG tablet [Pharmacy Med Name: Losartan  Potassium 100 MG Oral Tablet] 90 tablet 0    Sig: Take 1 tablet by mouth once daily     Cardiovascular:  Angiotensin Receptor Blockers Failed - 09/15/2024  3:41 PM      Failed - Last BP in normal range    BP Readings from Last 1 Encounters:  06/19/24 (!) 146/87         Passed - Cr in normal range and within 180 days    Creatinine, Ser  Date Value Ref Range Status  03/19/2024 1.17 0.76 - 1.27 mg/dL Final         Passed - K in normal range and within 180 days    Potassium  Date Value Ref Range Status  03/19/2024 4.3 3.5 - 5.2 mmol/L Final         Passed - Patient is not pregnant      Passed - Valid encounter within last 6 months    Recent Outpatient Visits           2 months ago Type 2 diabetes mellitus with hyperglycemia, without long-term current use of insulin (HCC)   [place] [facility], MD   6 months ago Annual physical exam   [place] [facility], [place], MD   9 months ago Type 2 diabetes mellitus with hyperglycemia, without long-term current use of insulin [facility])   [place] [facility], [place], MD   1 year ago Type 2 diabetes mellitus with hyperglycemia, without long-term current use of insulin [facility])   [place] [facility], MD   1 year ago Essential hypertension   [place] [facility], MD

## 2024-09-16 NOTE — Telephone Encounter (Signed)
 Dr. Tanda refilled yesterday

## 2024-09-22 ENCOUNTER — Ambulatory Visit: Admitting: Family Medicine

## 2024-09-22 ENCOUNTER — Ambulatory Visit: Payer: Self-pay | Admitting: Family Medicine

## 2024-09-22 VITALS — BP 145/87 | HR 67 | Ht 74.0 in | Wt 237.0 lb

## 2024-09-22 DIAGNOSIS — E1165 Type 2 diabetes mellitus with hyperglycemia: Secondary | ICD-10-CM

## 2024-09-22 DIAGNOSIS — I10 Essential (primary) hypertension: Secondary | ICD-10-CM | POA: Diagnosis not present

## 2024-09-22 DIAGNOSIS — E785 Hyperlipidemia, unspecified: Secondary | ICD-10-CM

## 2024-09-22 DIAGNOSIS — E1169 Type 2 diabetes mellitus with other specified complication: Secondary | ICD-10-CM

## 2024-09-22 DIAGNOSIS — Z7984 Long term (current) use of oral hypoglycemic drugs: Secondary | ICD-10-CM

## 2024-09-22 LAB — POCT GLYCOSYLATED HEMOGLOBIN (HGB A1C): HbA1c, POC (controlled diabetic range): 7.3 % — AB (ref 0.0–7.0)

## 2024-09-22 MED ORDER — LEVOTHYROXINE SODIUM 88 MCG PO TABS: 88.0000 ug | ORAL_TABLET | Freq: Every day | ORAL | 1 refills | Status: AC

## 2024-09-22 MED ORDER — TADALAFIL 20 MG PO TABS: 10.0000 mg | ORAL_TABLET | Freq: Every day | ORAL | 10 refills | Status: AC | PRN

## 2024-09-22 MED ORDER — LOSARTAN POTASSIUM 100 MG PO TABS: 100.0000 mg | ORAL_TABLET | Freq: Every day | ORAL | 1 refills | Status: AC

## 2024-09-22 MED ORDER — AMLODIPINE BESYLATE 5 MG PO TABS: 5.0000 mg | ORAL_TABLET | Freq: Every day | ORAL | 1 refills | Status: AC

## 2024-09-22 MED ORDER — GLIMEPIRIDE 4 MG PO TABS: 4.0000 mg | ORAL_TABLET | Freq: Every day | ORAL | 1 refills | Status: AC

## 2024-09-22 MED ORDER — FENOFIBRATE 48 MG PO TABS: 48.0000 mg | ORAL_TABLET | Freq: Every day | ORAL | 1 refills | Status: AC

## 2024-09-22 MED ORDER — ATORVASTATIN CALCIUM 20 MG PO TABS: 20.0000 mg | ORAL_TABLET | Freq: Every day | ORAL | 1 refills | Status: AC

## 2024-09-22 MED ORDER — METFORMIN HCL 1000 MG PO TABS: 1000.0000 mg | ORAL_TABLET | Freq: Two times a day (BID) | ORAL | 1 refills | Status: AC

## 2024-09-22 MED ORDER — PIOGLITAZONE HCL 45 MG PO TABS: 45.0000 mg | ORAL_TABLET | Freq: Every day | ORAL | 1 refills | Status: AC

## 2024-09-22 NOTE — Progress Notes (Unsigned)
Sh

## 2024-09-23 ENCOUNTER — Encounter: Payer: Self-pay | Admitting: Family Medicine

## 2024-09-23 NOTE — Progress Notes (Signed)
 Established Patient Office Visit  Subjective    Patient ID: Terry Horne, male    DOB: April 02, 1966  Age: 58 y.o. MRN: [id number]  CC:  Chief Complaint  Patient presents with   Medical Management of Chronic Issues    HPI Terry Horne presents for routine follow up of chronic med issues including diabetes and hypertension. Patient reports med compliance and denies acute complaints.   Outpatient Encounter Medications as of 09/22/2024  Medication Sig   atorvastatin  (LIPITOR) 20 MG tablet Take 1 tablet by mouth once daily   fenofibrate  (TRICOR ) 48 MG tablet Take 1 tablet by mouth once daily   Ferrous Sulfate (IRON PO) Take 1 tablet by mouth daily.   Multiple Vitamins-Minerals (CENTRUM ADULTS) TABS Take by mouth.   pioglitazone  (ACTOS ) 45 MG tablet Take 1 tablet by mouth once daily   Semaglutide, 1 MG/DOSE, (OZEMPIC, 1 MG/DOSE,) 2 MG/1.5ML SOPN Inject into the skin.   tadalafil  (CIALIS ) 10 MG tablet TAKE 1 TABLET BY MOUTH AS NEEDED FOR ERECTILE DYSFUNCTION   Testosterone Undecanoate (JATENZO) 237 MG CAPS Take by mouth.   [DISCONTINUED] amLODipine  (NORVASC ) 5 MG tablet Take 1 tablet by mouth once daily   [DISCONTINUED] atorvastatin  (LIPITOR) 20 MG tablet Take 1 tablet by mouth once daily   [DISCONTINUED] fenofibrate  (TRICOR ) 48 MG tablet Take 1 tablet by mouth once daily   [DISCONTINUED] glimepiride  (AMARYL ) 4 MG tablet Take 1 tablet by mouth once daily with breakfast   [DISCONTINUED] levothyroxine  (SYNTHROID ) 88 MCG tablet TAKE 1 TABLET BY MOUTH ONCE DAILY BEFORE BREAKFAST   [DISCONTINUED] losartan  (COZAAR ) 100 MG tablet Take 1 tablet by mouth once daily   [DISCONTINUED] metFORMIN  (GLUCOPHAGE ) 1000 MG tablet TAKE 1 TABLET BY MOUTH TWICE DAILY WITH A MEAL   [DISCONTINUED] pioglitazone  (ACTOS ) 45 MG tablet Take 1 tablet by mouth once daily   [DISCONTINUED] tadalafil  (CIALIS ) 20 MG tablet TAKE 1/2 TO 1 (ONE-HALF TO ONE) TABLET BY MOUTH EVERY OTHER DAY AS NEEDED FOR ERECTILE DYSFUNCTION    amLODipine  (NORVASC ) 5 MG tablet Take 1 tablet (5 mg total) by mouth daily.   atorvastatin  (LIPITOR) 20 MG tablet Take 1 tablet (20 mg total) by mouth daily.   fenofibrate  (TRICOR ) 48 MG tablet Take 1 tablet (48 mg total) by mouth daily.   glimepiride  (AMARYL ) 4 MG tablet Take 1 tablet (4 mg total) by mouth daily with breakfast.   levothyroxine  (SYNTHROID ) 88 MCG tablet Take 1 tablet (88 mcg total) by mouth daily before breakfast.   losartan  (COZAAR ) 100 MG tablet Take 1 tablet (100 mg total) by mouth daily.   metFORMIN  (GLUCOPHAGE ) 1000 MG tablet Take 1 tablet (1,000 mg total) by mouth 2 (two) times daily with a meal.   pioglitazone  (ACTOS ) 45 MG tablet Take 1 tablet (45 mg total) by mouth daily.   tadalafil  (CIALIS ) 20 MG tablet Take 0.5 tablets (10 mg total) by mouth daily as needed for erectile dysfunction.   No facility-administered encounter medications on file as of 09/22/2024.    Past Medical History:  Diagnosis Date   Diabetes mellitus without complication (HCC)     No past surgical history on file.  No family history on file.  Social History   Socioeconomic History   Marital status: Married    Spouse name: Not on file   Number of children: Not on file   Years of education: Not on file   Highest education level: Not on file  Occupational History   Not on file  Tobacco Use  Smoking status: Former   Smokeless tobacco: Never  Substance and Sexual Activity   Alcohol use: Yes    Alcohol/week: 0.0 standard drinks of alcohol   Drug use: No   Sexual activity: Yes  Other Topics Concern   Not on file  Social History Narrative   Not on file   Social Drivers of Health   Financial Resource Strain: Low Risk  (02/02/2023)   Overall Financial Resource Strain (CARDIA)    Difficulty of Paying Living Expenses: Not hard at all  Food Insecurity: No Food Insecurity (02/02/2023)   Hunger Vital Sign    Worried About Running Out of Food in the Last Year: Never true    Ran Out of  Food in the Last Year: Never true  Transportation Needs: No Transportation Needs (02/02/2023)   PRAPARE - Administrator, Civil Service (Medical): No    Lack of Transportation (Non-Medical): No  Physical Activity: Sufficiently Active (02/02/2023)   Exercise Vital Sign    Days of Exercise per Week: 7 days    Minutes of Exercise per Session: 30 min  Stress: No Stress Concern Present (02/02/2023)   [name] - Occupational Stress Questionnaire    Feeling of Stress : Not at all  Social Connections: Unknown (02/02/2023)   Social Connection and Isolation Panel    Frequency of Communication with Friends and Family: More than three times a week    Frequency of Social Gatherings with Friends and Family: More than three times a week    Attends Religious Services: More than 4 times per year    Active Member of [employer] or Organizations: Yes    Attends Banker Meetings: 1 to 4 times per year    Marital Status: Not on file  Intimate Partner Violence: Not At Risk (02/02/2023)   Humiliation, Afraid, Rape, and Kick questionnaire    Fear of Current or Ex-Partner: No    Emotionally Abused: No    Physically Abused: No    Sexually Abused: No    Review of Systems  All other systems reviewed and are negative.       Objective    BP (!) 145/87   Pulse 67   Ht 6' 2 (1.88 m)   Wt 237 lb (107.5 kg)   SpO2 98%   BMI 30.43 kg/m   Physical Exam Vitals and nursing note reviewed.  Constitutional:      General: He is not in acute distress. Cardiovascular:     Rate and Rhythm: Normal rate and regular rhythm.  Pulmonary:     Effort: Pulmonary effort is normal.     Breath sounds: Normal breath sounds.  Abdominal:     Palpations: Abdomen is soft.     Tenderness: There is no abdominal tenderness.  Neurological:     General: No focal deficit present.     Mental Status: He is alert and oriented to person, place, and time.         Assessment &  Plan:   Type 2 diabetes mellitus with hyperglycemia, without long-term current use of insulin (HCC) -     POCT glycosylated hemoglobin (Hb A1C) -     Glimepiride ; Take 1 tablet (4 mg total) by mouth daily with breakfast.  Dispense: 90 tablet; Refill: 1  Essential hypertension  Hyperlipidemia associated with type 2 diabetes mellitus (HCC) -     Atorvastatin  Calcium ; Take 1 tablet (20 mg total) by mouth daily.  Dispense: 90 tablet; Refill: 1  Other orders -     amLODIPine  Besylate; Take 1 tablet (5 mg total) by mouth daily.  Dispense: 90 tablet; Refill: 1 -     Tadalafil ; Take 0.5 tablets (10 mg total) by mouth daily as needed for erectile dysfunction.  Dispense: 10 tablet; Refill: 10 -     metFORMIN  HCl; Take 1 tablet (1,000 mg total) by mouth 2 (two) times daily with a meal.  Dispense: 180 tablet; Refill: 1 -     Losartan  Potassium; Take 1 tablet (100 mg total) by mouth daily.  Dispense: 90 tablet; Refill: 1 -     Levothyroxine  Sodium; Take 1 tablet (88 mcg total) by mouth daily before breakfast.  Dispense: 90 tablet; Refill: 1 -     Pioglitazone  HCl; Take 1 tablet (45 mg total) by mouth daily.  Dispense: 90 tablet; Refill: 1 -     Fenofibrate ; Take 1 tablet (48 mg total) by mouth daily.  Dispense: 90 tablet; Refill: 1     Return in about 3 months (around 12/21/2024).   Tanda Raguel SQUIBB, MD

## 2024-12-22 ENCOUNTER — Ambulatory Visit: Admitting: Family Medicine
